# Patient Record
Sex: Female | Born: 1965 | Race: White | Hispanic: No | Smoking: Former smoker
Health system: Southern US, Community
[De-identification: ages and names within clinical notes are randomized; demographics above are authoritative.]

## PROBLEM LIST (undated history)

## (undated) DIAGNOSIS — M1990 Unspecified osteoarthritis, unspecified site: Secondary | ICD-10-CM

## (undated) DIAGNOSIS — J45909 Unspecified asthma, uncomplicated: Secondary | ICD-10-CM

## (undated) HISTORY — PX: KNEE SURGERY: SHX244

## (undated) HISTORY — PX: BACK SURGERY: SHX140

## (undated) HISTORY — PX: TONSILLECTOMY: SUR1361

---

## 2015-03-01 ENCOUNTER — Other Ambulatory Visit (HOSPITAL_COMMUNITY): Payer: Self-pay | Admitting: Rheumatology

## 2015-03-01 ENCOUNTER — Ambulatory Visit (HOSPITAL_COMMUNITY)
Admission: RE | Admit: 2015-03-01 | Discharge: 2015-03-01 | Disposition: A | Discharge status: Home / Self Care | Payer: BLUE CROSS/BLUE SHIELD | Source: Ambulatory Visit | Attending: Rheumatology | Admitting: Rheumatology

## 2015-03-01 DIAGNOSIS — R079 Chest pain, unspecified: Secondary | ICD-10-CM | POA: Diagnosis not present

## 2015-04-26 ENCOUNTER — Other Ambulatory Visit: Payer: Self-pay

## 2015-04-26 ENCOUNTER — Encounter (HOSPITAL_COMMUNITY): Payer: Self-pay | Admitting: Emergency Medicine

## 2015-04-26 ENCOUNTER — Emergency Department (HOSPITAL_COMMUNITY)
Admission: EM | Admit: 2015-04-26 | Discharge: 2015-04-26 | Disposition: A | Discharge status: Home / Self Care | Payer: BLUE CROSS/BLUE SHIELD | Attending: Emergency Medicine | Admitting: Emergency Medicine

## 2015-04-26 ENCOUNTER — Emergency Department (HOSPITAL_COMMUNITY): Payer: BLUE CROSS/BLUE SHIELD

## 2015-04-26 DIAGNOSIS — J45901 Unspecified asthma with (acute) exacerbation: Secondary | ICD-10-CM | POA: Insufficient documentation

## 2015-04-26 DIAGNOSIS — Z880 Allergy status to penicillin: Secondary | ICD-10-CM | POA: Insufficient documentation

## 2015-04-26 DIAGNOSIS — J019 Acute sinusitis, unspecified: Secondary | ICD-10-CM | POA: Insufficient documentation

## 2015-04-26 DIAGNOSIS — Z8739 Personal history of other diseases of the musculoskeletal system and connective tissue: Secondary | ICD-10-CM | POA: Insufficient documentation

## 2015-04-26 DIAGNOSIS — H9209 Otalgia, unspecified ear: Secondary | ICD-10-CM | POA: Insufficient documentation

## 2015-04-26 DIAGNOSIS — Z79899 Other long term (current) drug therapy: Secondary | ICD-10-CM | POA: Insufficient documentation

## 2015-04-26 DIAGNOSIS — J9801 Acute bronchospasm: Secondary | ICD-10-CM

## 2015-04-26 DIAGNOSIS — R110 Nausea: Secondary | ICD-10-CM

## 2015-04-26 DIAGNOSIS — Z720 Tobacco use: Secondary | ICD-10-CM | POA: Insufficient documentation

## 2015-04-26 HISTORY — DX: Unspecified asthma, uncomplicated: J45.909

## 2015-04-26 HISTORY — DX: Unspecified osteoarthritis, unspecified site: M19.90

## 2015-04-26 LAB — CBC
HEMATOCRIT: 40.3 % (ref 36.0–46.0)
Hemoglobin: 13.7 g/dL (ref 12.0–15.0)
MCH: 33.3 pg (ref 26.0–34.0)
MCHC: 34 g/dL (ref 30.0–36.0)
MCV: 98.1 fL (ref 78.0–100.0)
Platelets: 328 10*3/uL (ref 150–400)
RBC: 4.11 MIL/uL (ref 3.87–5.11)
RDW: 15.1 % (ref 11.5–15.5)
WBC: 14.9 10*3/uL — ABNORMAL HIGH (ref 4.0–10.5)

## 2015-04-26 LAB — BASIC METABOLIC PANEL
Anion gap: 6 (ref 5–15)
BUN: 13 mg/dL (ref 6–20)
CHLORIDE: 107 mmol/L (ref 101–111)
CO2: 26 mmol/L (ref 22–32)
Calcium: 8.8 mg/dL — ABNORMAL LOW (ref 8.9–10.3)
Creatinine, Ser: 0.96 mg/dL (ref 0.44–1.00)
GFR calc Af Amer: >60 mL/min (ref >60)
GLUCOSE: 91 mg/dL (ref 65–99)
POTASSIUM: 3.7 mmol/L (ref 3.5–5.1)
Sodium: 139 mmol/L (ref 135–145)

## 2015-04-26 LAB — TROPONIN I: Troponin I: <0.03 ng/mL (ref <0.031)

## 2015-04-26 MED ORDER — ONDANSETRON 4 MG PO TBDP: 4.0000 mg | ORAL_TABLET | Freq: Three times a day (TID) | ORAL | Status: DC | PRN

## 2015-04-26 MED ORDER — CEFPROZIL 500 MG PO TABS: 500.0000 mg | ORAL_TABLET | Freq: Two times a day (BID) | ORAL | Status: DC

## 2015-04-26 MED ORDER — ALBUTEROL SULFATE HFA 108 (90 BASE) MCG/ACT IN AERS: 1.0000 | INHALATION_SPRAY | Freq: Four times a day (QID) | RESPIRATORY_TRACT | Status: DC | PRN

## 2015-04-26 NOTE — ED Notes (Signed)
Pt reports cough x3 weeks, n/v x3 days. Pt reports cp onset 15 minutes prior to arrival. Pt non-diaphoretic. Mild dyspnea noted with exertion. nad noted.

## 2015-04-26 NOTE — ED Provider Notes (Signed)
CSN: [id number]     Arrival date & time 04/26/15  1054 History  This chart was scribed for Rolland Porter, MD by Marica Otter, ED Scribe. This patient was seen in room APA04/APA04 and the patient's care was started at 12:37 PM.   Chief Complaint  Patient presents with  . Chest Pain   The history is provided by the patient. No language interpreter was used.   PCP: [facility] HPI Comments: Madeline Ayala is a 49 y.o. female, with PMHx noted below including asthma and daily tobacco use (0.75 ppd), who presents to the Emergency Department complaining of sudden onset, right sided, 7/10 chest pain onset 15 minutes PTA to the ED. Pt specifies that the pain originated in her throat and radiating down to her chest. Pt further reports having a worsening productive cough and ear pain for the past three weeks with associated n/v over the last three days. Pt notes some of the vomiting is a result of coughing fits. Pt reports she is currently taking methotrexate and prednisone  for arthritis flare up.   Past Medical History  Diagnosis Date  . Arthritis   . Asthma    Past Surgical History  Procedure Laterality Date  . Back surgery    . Tonsillectomy    . Knee surgery     No family history on file. Social History  Substance Use Topics  . Smoking status: Current Every Day Smoker -- 0.75 packs/day    Types: Cigarettes  . Smokeless tobacco: None  . Alcohol Use: No   OB History    No data available     Review of Systems  Constitutional: Negative for fever, chills, diaphoresis, appetite change and fatigue.  HENT: Positive for ear pain. Negative for mouth sores, sore throat and trouble swallowing.   Eyes: Negative for visual disturbance.  Respiratory: Positive for cough. Negative for chest tightness, shortness of breath and wheezing.   Cardiovascular: Negative for chest pain.  Gastrointestinal: Positive for nausea and vomiting. Negative for abdominal pain, diarrhea  and abdominal distention.  Endocrine: Negative for polydipsia, polyphagia and polyuria.  Genitourinary: Negative for dysuria, frequency and hematuria.  Musculoskeletal: Negative for gait problem.  Skin: Negative for color change, pallor and rash.  Neurological: Negative for dizziness, syncope, light-headedness and headaches.  Hematological: Does not bruise/bleed easily.  Psychiatric/Behavioral: Negative for behavioral problems and confusion.   Allergies  Azithromycin; Codeine; Hydrocodone; Oxycodone; Penicillins; Percocet; Propoxyphene; Sulfa antibiotics; Tetracyclines & related; Tolectin; and Vancomycin  Home Medications   Prior to Admission medications   Medication Sig Start Date End Date Taking? Authorizing Provider  folic acid (FOLVITE) 1 MG tablet Take 2 mg by mouth daily.   Yes Historical Provider, MD  methotrexate (RHEUMATREX) 2.5 MG tablet Take 8 mg by mouth once a week. Caution:Chemotherapy. Protect from light.   Yes Historical Provider, MD  predniSONE (DELTASONE) 10 MG tablet Take 10 mg by mouth daily with breakfast.   Yes Historical Provider, MD  albuterol (PROVENTIL HFA;VENTOLIN HFA) 108 (90 BASE) MCG/ACT inhaler Inhale 1-2 puffs into the lungs every 6 (six) hours as needed for wheezing. 04/26/15   Rolland Porter, MD  cefPROZIL (CEFZIL) 500 MG tablet Take 1 tablet (500 mg total) by mouth 2 (two) times daily. 04/26/15   Rolland Porter, MD  ondansetron (ZOFRAN ODT) 4 MG disintegrating tablet Take 1 tablet (4 mg total) by mouth every 8 (eight) hours as needed for nausea. 04/26/15   Rolland Porter, MD   Triage Vitals: BP  116/72 mmHg  Pulse 68  Temp(Src) 97.9 F (36.6 C) (Oral)  Resp 16  Ht  (1.702 m)  Wt 185 lb (83.915 kg)  BMI 28.97 kg/m2  SpO2 100%  LMP 04/10/2015 Physical Exam  Constitutional: She is oriented to person, place, and time. She appears well-developed and well-nourished. No distress.  HENT:  Head: Normocephalic.  Nose: Mucosal edema and rhinorrhea present. Right  sinus exhibits maxillary sinus tenderness. Left sinus exhibits maxillary sinus tenderness.  Eyes: Conjunctivae are normal. Pupils are equal, round, and reactive to light. No scleral icterus.  Neck: Normal range of motion. Neck supple. No thyromegaly present.  Cardiovascular: Normal rate and regular rhythm.  Exam reveals no gallop and no friction rub.   No murmur heard. Pulmonary/Chest: Effort normal. No respiratory distress. She has wheezes (diffused wheezing with mild expiration ). She has no rales.  Abdominal: Soft. Bowel sounds are normal. She exhibits no distension. There is no tenderness. There is no rebound.  Musculoskeletal: Normal range of motion.  Neurological: She is alert and oriented to person, place, and time.  Skin: Skin is warm and dry. No rash noted.  Psychiatric: She has a normal mood and affect. Her behavior is normal.   ED Course  Procedures (including critical care time) DIAGNOSTIC STUDIES: Oxygen Saturation is 100% on RA, nl by my interpretation.    COORDINATION OF CARE: 12:43 PM: Discussed treatment plan which includes imaging, labs, EKG results and meds (sudafed, antibiotics, nebulizer) with pt at bedside; patient verbalizes understanding and agrees with treatment plan. Pt declines IV fluids and nebulizer Tx at ED wishes to go home with Rx instead.   Labs Review Labs Reviewed  BASIC METABOLIC PANEL - Abnormal; Notable for the following:    Calcium 8.8 (*)    All other components within normal limits  CBC - Abnormal; Notable for the following:    WBC 14.9 (*)    All other components within normal limits  TROPONIN I    Imaging Review Dg Chest 2 View  04/26/2015   CLINICAL DATA:  Acute right-sided chest pain.  EXAM: CHEST  2 VIEW  COMPARISON:  None.  FINDINGS: The heart size and mediastinal contours are within normal limits. Both lungs are clear. No pneumothorax or pleural effusion is noted. The visualized skeletal structures are unremarkable.  IMPRESSION: No  active cardiopulmonary disease.   Electronically Signed   By: Lupita Raider, M.D.   On: 04/26/2015 12:16   I have personally reviewed and evaluated these images and lab results as part of my medical decision-making.   EKG Interpretation None      MDM   Final diagnoses:  Acute sinusitis, recurrence not specified, unspecified location  Bronchospasm  Nausea   Chin with a multitude of complaints and symptoms. Clinically has normal electrolytes and chest x-ray without pneumonia. Sinusitis, bronchospasm, nausea. Offered treatment including IV medications fluids nebulizer. Politely declines. States she got her prescription go home that she feels "tired and worn out". No electrolyte abnormalities or clinical conditions that would preclude this being a safe option for her.  I personally performed the services described in this documentation, which was scribed in my presence. The recorded information has been reviewed and is accurate.    Rolland Porter, MD 04/26/15 1255

## 2015-04-26 NOTE — Discharge Instructions (Signed)
Asthma °Asthma is a recurring condition in which the airways tighten and narrow. Asthma can make it difficult to breathe. It can cause coughing, wheezing, and shortness of breath. Asthma episodes, also called asthma attacks, range from minor to life-threatening. Asthma cannot be cured, but medicines and lifestyle changes can help control it. °CAUSES °Asthma is believed to be caused by inherited (genetic) and environmental factors, but its exact cause is unknown. Asthma may be triggered by allergens, lung infections, or irritants in the air. Asthma triggers are different for each person. Common triggers include:  °· Animal dander. °· Dust mites. °· Cockroaches. °· Pollen from trees or grass. °· Mold. °· Smoke. °· Air pollutants such as dust, household cleaners, hair sprays, aerosol sprays, paint fumes, strong chemicals, or strong odors. °· Cold air, weather changes, and winds (which increase molds and pollens in the air). °· Strong emotional expressions such as crying or laughing hard. °· Stress. °· Certain medicines (such as aspirin) or types of drugs (such as beta-blockers). °· Sulfites in foods and drinks. Foods and drinks that may contain sulfites include dried fruit, potato chips, and sparkling grape juice. °· Infections or inflammatory conditions such as the flu, a cold, or an inflammation of the nasal membranes (rhinitis). °· Gastroesophageal reflux disease (GERD). °· Exercise or strenuous activity. °SYMPTOMS °Symptoms may occur immediately after asthma is triggered or many hours later. Symptoms include: °· Wheezing. °· Excessive nighttime or early morning coughing. °· Frequent or severe coughing with a common cold. °· Chest tightness. °· Shortness of breath. °DIAGNOSIS  °The diagnosis of asthma is made by a review of your medical history and a physical exam. Tests may also be performed. These may include: °· Lung function studies. These tests show how much air you breathe in and out. °· Allergy  tests. °· Imaging tests such as X-rays. °TREATMENT  °Asthma cannot be cured, but it can usually be controlled. Treatment involves identifying and avoiding your asthma triggers. It also involves medicines. There are 2 classes of medicine used for asthma treatment:  °· Controller medicines. These prevent asthma symptoms from occurring. They are usually taken every day. °· Reliever or rescue medicines. These quickly relieve asthma symptoms. They are used as needed and provide short-term relief. °Your health care provider will help you create an asthma action plan. An asthma action plan is a written plan for managing and treating your asthma attacks. It includes a list of your asthma triggers and how they may be avoided. It also includes information on when medicines should be taken and when their dosage should be changed. An action plan may also involve the use of a device called a peak flow meter. A peak flow meter measures how well the lungs are working. It helps you monitor your condition. °HOME CARE INSTRUCTIONS  °· Take medicines only as directed by your health care provider. Speak with your health care provider if you have questions about how or when to take the medicines. °· Use a peak flow meter as directed by your health care provider. Record and keep track of readings. °· Understand and use the action plan to help minimize or stop an asthma attack without needing to seek medical care. °· Control your home environment in the following ways to help prevent asthma attacks: °¨ Do not smoke. Avoid being exposed to secondhand smoke. °¨ Change your heating and air conditioning filter regularly. °¨ Limit your use of fireplaces and wood stoves. °¨ Get rid of pests (such as roaches and   mice) and their droppings.  Throw away plants if you see mold on them.  Clean your floors and dust regularly. Use unscented cleaning products.  Try to have someone else vacuum for you regularly. Stay out of rooms while they are  being vacuumed and for a short while afterward. If you vacuum, use a dust mask from a hardware store, a double-layered or microfilter vacuum cleaner bag, or a vacuum cleaner with a HEPA filter.  Replace carpet with wood, tile, or vinyl flooring. Carpet can trap dander and dust.  Use allergy-proof pillows, mattress covers, and box spring covers.  Wash bed sheets and blankets every week in hot water and dry them in a dryer.  Use blankets that are made of polyester or cotton.  Clean bathrooms and kitchens with bleach. If possible, have someone repaint the walls in these rooms with mold-resistant paint. Keep out of the rooms that are being cleaned and painted.  Wash hands frequently. SEEK MEDICAL CARE IF:   You have wheezing, shortness of breath, or a cough even if taking medicine to prevent attacks.  The colored mucus you cough up (sputum) is thicker than usual.  Your sputum changes from clear or white to yellow, green, gray, or bloody.  You have any problems that may be related to the medicines you are taking (such as a rash, itching, swelling, or trouble breathing).  You are using a reliever medicine more than 2-3 times per week.  Your peak flow is still at 50-79% of your personal best after following your action plan for 1 hour.  You have a fever. SEEK IMMEDIATE MEDICAL CARE IF:   You seem to be getting worse and are unresponsive to treatment during an asthma attack.  You are short of breath even at rest.  You get short of breath when doing very little physical activity.  You have difficulty eating, drinking, or talking due to asthma symptoms.  You develop chest pain.  You develop a fast heartbeat.  You have a bluish color to your lips or fingernails.  You are light-headed, dizzy, or faint.  Your peak flow is less than 50% of your personal best. MAKE SURE YOU:   Understand these instructions.  Will watch your condition.  Will get help right away if you are not  doing well or get worse. Document Released: 07/23/2005 Document Revised: 12/07/2013 Document Reviewed: 02/19/2013 [facility] Patient Information 2015 [place], [place]. This information is not intended to replace advice given to you by your health care provider. Make sure you discuss any questions you have with your health care provider.  Sinusitis Sinusitis is redness, soreness, and puffiness (inflammation) of the air pockets in the bones of your face (sinuses). The redness, soreness, and puffiness can cause air and mucus to get trapped in your sinuses. This can allow germs to grow and cause an infection.  HOME CARE   Drink enough fluids to keep your pee (urine) clear or pale yellow.  Use a humidifier in your home.  Run a hot shower to create steam in the bathroom. Sit in the bathroom with the door closed. Breathe in the steam 3-4 times a day.  Put a warm, moist washcloth on your face 3-4 times a day, or as told by your doctor.  Use salt water sprays (saline sprays) to wet the thick fluid in your nose. This can help the sinuses drain.  Only take medicine as told by your doctor. GET HELP RIGHT AWAY IF:   Your pain gets worse.  You have very bad headaches.  You are sick to your stomach (nauseous).  You throw up (vomit).  You are very sleepy (drowsy) all the time.  Your face is puffy (swollen).  Your vision changes.  You have a stiff neck.  You have trouble breathing. MAKE SURE YOU:   Understand these instructions.  Will watch your condition.  Will get help right away if you are not doing well or get worse. Document Released: 01/09/2008 Document Revised: 04/16/2012 Document Reviewed: 02/26/2012 [facility] Patient Information 2015 [place], [place]. This information is not intended to replace advice given to you by your health care provider. Make sure you discuss any questions you have with your health care provider.

## 2015-06-23 ENCOUNTER — Other Ambulatory Visit (HOSPITAL_COMMUNITY)
Admission: RE | Admit: 2015-06-23 | Discharge: 2015-06-23 | Disposition: A | Discharge status: Home / Self Care | Payer: 59 | Source: Ambulatory Visit | Attending: Interventional Radiology | Admitting: Interventional Radiology

## 2015-06-23 DIAGNOSIS — Z029 Encounter for administrative examinations, unspecified: Secondary | ICD-10-CM | POA: Insufficient documentation

## 2015-06-23 LAB — COMPREHENSIVE METABOLIC PANEL
ALBUMIN: 4.1 g/dL (ref 3.5–5.0)
ALK PHOS: 55 U/L (ref 38–126)
ALT: 11 U/L — AB (ref 14–54)
AST: 18 U/L (ref 15–41)
Anion gap: 8 (ref 5–15)
BILIRUBIN TOTAL: 0.4 mg/dL (ref 0.3–1.2)
BUN: 10 mg/dL (ref 6–20)
CALCIUM: 9.1 mg/dL (ref 8.9–10.3)
CO2: 22 mmol/L (ref 22–32)
CREATININE: 1.04 mg/dL — AB (ref 0.44–1.00)
Chloride: 107 mmol/L (ref 101–111)
GFR calc Af Amer: >60 mL/min (ref >60)
GLUCOSE: 103 mg/dL — AB (ref 65–99)
Potassium: 3.8 mmol/L (ref 3.5–5.1)
Sodium: 137 mmol/L (ref 135–145)
TOTAL PROTEIN: 7.1 g/dL (ref 6.5–8.1)

## 2015-06-23 LAB — CBC WITH DIFFERENTIAL/PLATELET
BASOS ABS: 0 10*3/uL (ref 0.0–0.1)
BASOS PCT: 0 %
Eosinophils Absolute: 0.1 10*3/uL (ref 0.0–0.7)
Eosinophils Relative: 1 %
HEMATOCRIT: 40.2 % (ref 36.0–46.0)
HEMOGLOBIN: 13.5 g/dL (ref 12.0–15.0)
LYMPHS PCT: 45 %
Lymphs Abs: 5 10*3/uL — ABNORMAL HIGH (ref 0.7–4.0)
MCH: 34 pg (ref 26.0–34.0)
MCHC: 33.6 g/dL (ref 30.0–36.0)
MCV: 101.3 fL — AB (ref 78.0–100.0)
MONO ABS: 0.5 10*3/uL (ref 0.1–1.0)
Monocytes Relative: 5 %
NEUTROS ABS: 5.4 10*3/uL (ref 1.7–7.7)
NEUTROS PCT: 49 %
Platelets: 334 10*3/uL (ref 150–400)
RBC: 3.97 MIL/uL (ref 3.87–5.11)
RDW: 14.9 % (ref 11.5–15.5)
WBC: 11 10*3/uL — ABNORMAL HIGH (ref 4.0–10.5)

## 2015-10-10 ENCOUNTER — Other Ambulatory Visit (HOSPITAL_COMMUNITY)
Admission: RE | Admit: 2015-10-10 | Discharge: 2015-10-10 | Disposition: A | Discharge status: Home / Self Care | Payer: 59 | Source: Ambulatory Visit | Attending: Rheumatology | Admitting: Rheumatology

## 2015-10-10 DIAGNOSIS — Z79899 Other long term (current) drug therapy: Secondary | ICD-10-CM | POA: Insufficient documentation

## 2015-10-10 DIAGNOSIS — Z5181 Encounter for therapeutic drug level monitoring: Secondary | ICD-10-CM | POA: Insufficient documentation

## 2015-10-10 LAB — CBC WITH DIFFERENTIAL/PLATELET
BASOS ABS: 0 10*3/uL (ref 0.0–0.1)
BASOS PCT: 0 %
EOS ABS: 0.1 10*3/uL (ref 0.0–0.7)
Eosinophils Relative: 1 %
HEMATOCRIT: 42 % (ref 36.0–46.0)
HEMOGLOBIN: 14.2 g/dL (ref 12.0–15.0)
Lymphocytes Relative: 34 %
Lymphs Abs: 4.4 10*3/uL — ABNORMAL HIGH (ref 0.7–4.0)
MCH: 34.1 pg — ABNORMAL HIGH (ref 26.0–34.0)
MCHC: 33.8 g/dL (ref 30.0–36.0)
MCV: 100.7 fL — ABNORMAL HIGH (ref 78.0–100.0)
Monocytes Absolute: 0.6 10*3/uL (ref 0.1–1.0)
Monocytes Relative: 5 %
NEUTROS ABS: 7.7 10*3/uL (ref 1.7–7.7)
Neutrophils Relative %: 60 %
Platelets: 276 10*3/uL (ref 150–400)
RBC: 4.17 MIL/uL (ref 3.87–5.11)
RDW: 13.8 % (ref 11.5–15.5)
WBC: 12.8 10*3/uL — ABNORMAL HIGH (ref 4.0–10.5)

## 2015-10-10 LAB — COMPREHENSIVE METABOLIC PANEL
ALBUMIN: 4 g/dL (ref 3.5–5.0)
ALK PHOS: 68 U/L (ref 38–126)
ALT: 15 U/L (ref 14–54)
ANION GAP: 5 (ref 5–15)
AST: 16 U/L (ref 15–41)
BUN: 12 mg/dL (ref 6–20)
CALCIUM: 9 mg/dL (ref 8.9–10.3)
CO2: 26 mmol/L (ref 22–32)
CREATININE: 0.93 mg/dL (ref 0.44–1.00)
Chloride: 105 mmol/L (ref 101–111)
GFR calc Af Amer: >60 mL/min (ref >60)
GFR calc non Af Amer: >60 mL/min (ref >60)
GLUCOSE: 91 mg/dL (ref 65–99)
Potassium: 4 mmol/L (ref 3.5–5.1)
SODIUM: 136 mmol/L (ref 135–145)
Total Bilirubin: 0.5 mg/dL (ref 0.3–1.2)
Total Protein: 7.2 g/dL (ref 6.5–8.1)

## 2016-02-14 ENCOUNTER — Other Ambulatory Visit (HOSPITAL_COMMUNITY)
Admission: RE | Admit: 2016-02-14 | Discharge: 2016-02-14 | Disposition: A | Discharge status: Home / Self Care | Payer: 59 | Source: Ambulatory Visit | Attending: Rheumatology | Admitting: Rheumatology

## 2016-02-14 DIAGNOSIS — Z79899 Other long term (current) drug therapy: Secondary | ICD-10-CM | POA: Diagnosis present

## 2016-02-14 LAB — COMPREHENSIVE METABOLIC PANEL
ALBUMIN: 4.3 g/dL (ref 3.5–5.0)
ALK PHOS: 68 U/L (ref 38–126)
ALT: 17 U/L (ref 14–54)
ANION GAP: 7 (ref 5–15)
AST: 23 U/L (ref 15–41)
BUN: 12 mg/dL (ref 6–20)
CALCIUM: 9.5 mg/dL (ref 8.9–10.3)
CHLORIDE: 106 mmol/L (ref 101–111)
CO2: 24 mmol/L (ref 22–32)
Creatinine, Ser: 1.03 mg/dL — ABNORMAL HIGH (ref 0.44–1.00)
GFR calc non Af Amer: >60 mL/min (ref >60)
Glucose, Bld: 143 mg/dL — ABNORMAL HIGH (ref 65–99)
POTASSIUM: 3.7 mmol/L (ref 3.5–5.1)
SODIUM: 137 mmol/L (ref 135–145)
TOTAL PROTEIN: 7.6 g/dL (ref 6.5–8.1)
Total Bilirubin: 0.6 mg/dL (ref 0.3–1.2)

## 2016-02-14 LAB — CBC WITH DIFFERENTIAL/PLATELET
BASOS PCT: 0 %
Basophils Absolute: 0 10*3/uL (ref 0.0–0.1)
EOS ABS: 0 10*3/uL (ref 0.0–0.7)
EOS PCT: 0 %
HCT: 40.2 % (ref 36.0–46.0)
HEMOGLOBIN: 13.8 g/dL (ref 12.0–15.0)
LYMPHS ABS: 4.7 10*3/uL — AB (ref 0.7–4.0)
Lymphocytes Relative: 42 %
MCH: 34.8 pg — AB (ref 26.0–34.0)
MCHC: 34.3 g/dL (ref 30.0–36.0)
MCV: 101.3 fL — ABNORMAL HIGH (ref 78.0–100.0)
MONOS PCT: 4 %
Monocytes Absolute: 0.4 10*3/uL (ref 0.1–1.0)
NEUTROS PCT: 54 %
Neutro Abs: 6 10*3/uL (ref 1.7–7.7)
PLATELETS: 332 10*3/uL (ref 150–400)
RBC: 3.97 MIL/uL (ref 3.87–5.11)
RDW: 13.5 % (ref 11.5–15.5)
WBC: 11.2 10*3/uL — AB (ref 4.0–10.5)

## 2016-10-23 ENCOUNTER — Telehealth: Payer: Self-pay | Admitting: Radiology

## 2016-10-23 ENCOUNTER — Other Ambulatory Visit (HOSPITAL_COMMUNITY)
Admission: RE | Admit: 2016-10-23 | Discharge: 2016-10-23 | Disposition: A | Discharge status: Home / Self Care | Payer: 59 | Source: Ambulatory Visit | Attending: Rheumatology | Admitting: Rheumatology

## 2016-10-23 DIAGNOSIS — Z79899 Other long term (current) drug therapy: Secondary | ICD-10-CM | POA: Diagnosis not present

## 2016-10-23 LAB — CBC WITH DIFFERENTIAL/PLATELET
BASOS ABS: 0 10*3/uL (ref 0.0–0.1)
BASOS PCT: 0 %
EOS ABS: 0.1 10*3/uL (ref 0.0–0.7)
EOS PCT: 1 %
HCT: 39.1 % (ref 36.0–46.0)
Hemoglobin: 13 g/dL (ref 12.0–15.0)
LYMPHS PCT: 28 %
Lymphs Abs: 3.2 10*3/uL (ref 0.7–4.0)
MCH: 33.6 pg (ref 26.0–34.0)
MCHC: 33.2 g/dL (ref 30.0–36.0)
MCV: 101 fL — AB (ref 78.0–100.0)
Monocytes Absolute: 0.8 10*3/uL (ref 0.1–1.0)
Monocytes Relative: 7 %
Neutro Abs: 7.4 10*3/uL (ref 1.7–7.7)
Neutrophils Relative %: 64 %
PLATELETS: 271 10*3/uL (ref 150–400)
RBC: 3.87 MIL/uL (ref 3.87–5.11)
RDW: 13.2 % (ref 11.5–15.5)
WBC: 11.6 10*3/uL — AB (ref 4.0–10.5)

## 2016-10-23 LAB — COMPREHENSIVE METABOLIC PANEL
ALBUMIN: 4.4 g/dL (ref 3.5–5.0)
ALT: 17 U/L (ref 14–54)
AST: 19 U/L (ref 15–41)
Alkaline Phosphatase: 70 U/L (ref 38–126)
Anion gap: 6 (ref 5–15)
BUN: 13 mg/dL (ref 6–20)
CHLORIDE: 103 mmol/L (ref 101–111)
CO2: 29 mmol/L (ref 22–32)
CREATININE: 0.94 mg/dL (ref 0.44–1.00)
Calcium: 9.8 mg/dL (ref 8.9–10.3)
GFR calc Af Amer: >60 mL/min (ref >60)
GFR calc non Af Amer: >60 mL/min (ref >60)
GLUCOSE: 80 mg/dL (ref 65–99)
POTASSIUM: 4.6 mmol/L (ref 3.5–5.1)
SODIUM: 138 mmol/L (ref 135–145)
Total Bilirubin: 0.4 mg/dL (ref 0.3–1.2)
Total Protein: 7.6 g/dL (ref 6.5–8.1)

## 2016-10-23 NOTE — Telephone Encounter (Signed)
-----   Message from Pollyann SavoyShaili Deveshwar, MD sent at 10/23/2016 12:49 PM EDT ----- Labs are stable

## 2016-10-23 NOTE — Telephone Encounter (Signed)
I have called patient to advise labs are stable  

## 2016-10-23 NOTE — Progress Notes (Signed)
Labs are stable.

## 2016-10-29 DIAGNOSIS — M5136 Other intervertebral disc degeneration, lumbar region: Secondary | ICD-10-CM | POA: Insufficient documentation

## 2016-10-29 DIAGNOSIS — Z8709 Personal history of other diseases of the respiratory system: Secondary | ICD-10-CM | POA: Insufficient documentation

## 2016-10-29 DIAGNOSIS — L409 Psoriasis, unspecified: Secondary | ICD-10-CM | POA: Insufficient documentation

## 2016-10-29 DIAGNOSIS — Z8719 Personal history of other diseases of the digestive system: Secondary | ICD-10-CM | POA: Insufficient documentation

## 2016-10-29 DIAGNOSIS — L4050 Arthropathic psoriasis, unspecified: Secondary | ICD-10-CM | POA: Insufficient documentation

## 2016-10-29 DIAGNOSIS — F40298 Other specified phobia: Secondary | ICD-10-CM | POA: Insufficient documentation

## 2016-10-29 DIAGNOSIS — M5030 Other cervical disc degeneration, unspecified cervical region: Secondary | ICD-10-CM | POA: Insufficient documentation

## 2016-10-29 DIAGNOSIS — Z8739 Personal history of other diseases of the musculoskeletal system and connective tissue: Secondary | ICD-10-CM | POA: Insufficient documentation

## 2016-10-29 DIAGNOSIS — Z79899 Other long term (current) drug therapy: Secondary | ICD-10-CM | POA: Insufficient documentation

## 2016-10-29 NOTE — Progress Notes (Addendum)
Office Visit Note  Patient: Madeline Ayala             Date of Birth: 11/19/1965           MRN: [id number]             PCP: [facility] Referring: [facility] Visit Date: 10/30/2016 Occupation: '@GUAROCC'$ @    Subjective:  Joint Pain (has ran out of meds. c/o stiffness pain ) and Medication Management (wants to know if we can work on approval for [employer] )   History of Present Illness: Madeline Ayala is a 51 y.o. Ayala  Off of MTX for 3 weeks. Trouble paying for medications, labs, office visit. Also, mtx not as effective now as it use to be (before she was off of mtx for about 3 weeks).  We tried to see if otezla would be an option.   Having swelling in hands and feet and psoriasis flaring. joint pain affecting job.  Not taking clobetasol because of the medicine is too expensive.   Activities of Daily Living:  Patient reports morning stiffness for 120 minute.   Patient Reports nocturnal pain.  Difficulty dressing/grooming: Reports Difficulty climbing stairs: Reports Difficulty getting out of chair: Reports Difficulty using hands for taps, buttons, cutlery, and/or writing: Reports   Review of Systems  Constitutional: Negative for fatigue.  HENT: Negative for mouth sores and mouth dryness.   Eyes: Negative for dryness.  Respiratory: Negative for shortness of breath.   Gastrointestinal: Negative for constipation and diarrhea.  Musculoskeletal: Negative for myalgias and myalgias.  Skin: Negative for sensitivity to sunlight.  Psychiatric/Behavioral: Negative for decreased concentration and sleep disturbance.    PMFS History:  Patient Active Problem List   Diagnosis Date Noted  . Psoriatic arthritis ([place]) 10/29/2016  . Psoriasis 10/29/2016  . High risk medication use 10/29/2016  . Needle phobia 10/29/2016  . DDD (degenerative disc disease), lumbar 10/29/2016  . DDD (degenerative disc disease), cervical 10/29/2016  . History  of scoliosis 10/29/2016  . History of asthma 10/29/2016  . History of duodenal ulcer 10/29/2016    Past Medical History:  Diagnosis Date  . Arthritis   . Asthma     No family history on file. Past Surgical History:  Procedure Laterality Date  . BACK SURGERY    . KNEE SURGERY    . TONSILLECTOMY     Social History   Social History Narrative  . No narrative on file     Objective: Vital Signs: BP 98/60   Pulse (!) 58   Resp 16   Ht '5\' 7"'$  (1.702 m)   Wt 156 lb (70.8 kg)   LMP 04/10/2015   BMI 24.43 kg/m    Physical Exam  Constitutional: She is oriented to person, place, and time. She appears well-developed and well-nourished.  HENT:  Head: Normocephalic and atraumatic.  Eyes: EOM are normal. Pupils are equal, round, and reactive to light.  Cardiovascular: Normal rate, regular rhythm and normal heart sounds.  Exam reveals no gallop and no friction rub.   No murmur heard. Pulmonary/Chest: Effort normal and breath sounds normal. She has no wheezes. She has no rales.  Abdominal: Soft. Bowel sounds are normal. She exhibits no distension. There is no tenderness. There is no guarding. No hernia.  Musculoskeletal: Normal range of motion. She exhibits no edema, tenderness or deformity.  Lymphadenopathy:    She has no cervical adenopathy.  Neurological: She is alert and oriented to person, place, and  time. Coordination normal.  Skin: Skin is warm and dry. Capillary refill takes less than 2 seconds. No rash noted.  Psychiatric: She has a normal mood and affect. Her behavior is normal.  Nursing note and vitals reviewed.    Musculoskeletal Exam:  Full range of motion of all joints Grip strength is equal and strong bilaterally Fibromyalgia tender points are all absent  CDAI Exam: CDAI Homunculus Exam:   Tenderness:  Right hand: 1st MCP, 2nd MCP, 3rd MCP, 4th MCP and 5th MCP Left hand: 1st MCP, 2nd MCP, 3rd MCP, 4th MCP and 5th MCP  Swelling:  Right hand: 3rd MCP Left  hand: 3rd MCP  Joint Counts:  CDAI Tender Joint count: 10 CDAI Swollen Joint count: 2  Mild synovitis to a few joints noted above.   Investigation: Findings:  Labs from February 14, 2016 show CMP with GFR normal except for elevated creatinine at 103, but close to normal limits.  GFR is normal.  CBC with diff is normal except for MCV slightly elevated at 101.3, which she can discuss with her PCP.    Bilateral hands, 2 views, show DIP and PIP narrowing bilaterally.  Mild MCP narrowing bilaterally.  No erosions.  No comparisons are available.    Bilateral feet, 2 views, show DIP and PIP joint space narrowing.  No erosions.   Hospital Outpatient Visit on 10/23/2016  Component Date Value Ref Range Status  . WBC 10/23/2016 11.6* 4.0 - 10.5 K/uL Final  . RBC 10/23/2016 3.87  3.87 - 5.11 MIL/uL Final  . Hemoglobin 10/23/2016 13.0  12.0 - 15.0 g/dL Final  . HCT 10/23/2016 39.1  36.0 - 46.0 % Final  . MCV 10/23/2016 101.0* 78.0 - 100.0 fL Final  . MCH 10/23/2016 33.6  26.0 - 34.0 pg Final  . MCHC 10/23/2016 33.2  30.0 - 36.0 g/dL Final  . RDW 10/23/2016 13.2  11.5 - 15.5 % Final  . Platelets 10/23/2016 271  150 - 400 K/uL Final  . Neutrophils Relative % 10/23/2016 64  % Final  . Neutro Abs 10/23/2016 7.4  1.7 - 7.7 K/uL Final  . Lymphocytes Relative 10/23/2016 28  % Final  . Lymphs Abs 10/23/2016 3.2  0.7 - 4.0 K/uL Final  . Monocytes Relative 10/23/2016 7  % Final  . Monocytes Absolute 10/23/2016 0.8  0.1 - 1.0 K/uL Final  . Eosinophils Relative 10/23/2016 1  % Final  . Eosinophils Absolute 10/23/2016 0.1  0.0 - 0.7 K/uL Final  . Basophils Relative 10/23/2016 0  % Final  . Basophils Absolute 10/23/2016 0.0  0.0 - 0.1 K/uL Final  . Sodium 10/23/2016 138  135 - 145 mmol/L Final  . Potassium 10/23/2016 4.6  3.5 - 5.1 mmol/L Final  . Chloride 10/23/2016 103  101 - 111 mmol/L Final  . CO2 10/23/2016 29  22 - 32 mmol/L Final  . Glucose, Bld 10/23/2016 80  65 - 99 mg/dL Final  . BUN 10/23/2016  13  6 - 20 mg/dL Final  . Creatinine, Ser 10/23/2016 0.94  0.44 - 1.00 mg/dL Final  . Calcium 10/23/2016 9.8  8.9 - 10.3 mg/dL Final  . Total Protein 10/23/2016 7.6  6.5 - 8.1 g/dL Final  . Albumin 10/23/2016 4.4  3.5 - 5.0 g/dL Final  . AST 10/23/2016 19  15 - 41 U/L Final  . ALT 10/23/2016 17  14 - 54 U/L Final  . Alkaline Phosphatase 10/23/2016 70  38 - 126 U/L Final  . Total Bilirubin 10/23/2016 0.4  0.3 - 1.2 mg/dL Final  . GFR calc non Af Amer 10/23/2016 >60  >60 mL/min Final  . GFR calc Af Amer 10/23/2016 >60  >60 mL/min Final   Comment: (NOTE) The eGFR has been calculated using the CKD EPI equation. This calculation has not been validated in all clinical situations. eGFR's persistently <60 mL/min signify possible Chronic Kidney Disease.   . Anion gap 10/23/2016 6  5 - 15 Final      Imaging: No results found.  Speciality Comments: No specialty comments available.    Procedures:  No procedures performed Allergies: Azithromycin; Erythromycin; Penicillins; Sulfa antibiotics; Tetracyclines & related; Tolectin [tolmetin]; Vancomycin; Codeine; Hydrocodone; Oxycodone; Percocet [oxycodone-acetaminophen]; and Propoxyphene   Assessment / Plan:     Visit Diagnoses: Psoriatic arthritis ([place])  Psoriasis - [place]  High risk medication use - ?methotrexate- 2.'5mg'$  10 per week in dictation  Needle phobia  DDD (degenerative disc disease), cervical  DDD (degenerative disc disease), lumbar  History of scoliosis  History of asthma - mild  History of duodenal ulcer   Plan: #1: Psoriatic arthritis and psoriasis. Currently having a flare. Complaining of pain to all joints. Difficulty due her work. Was not responding well to the methotrexate anymore. Then for the last 3 weeks, she was off of her methotrexate and ended up having more pain and more flare.  #2: High risk prescription. Patient has needle phobia and cannot take injectable methotrexate (which actually is more  affordable at $30 for 3 month supply versus $120 for 3 month supply). She's been off of the medication for 3 weeks now. Prior to being off of the medication, the methotrexate was not working as well for her as it used to.  #3: CBC with differential and CMP with GFR were within normal limits as of March  #4: Apply for [facility]. Patient is very worried that her [place] healthcare will not pay for all of the medication in the 20% that she might be responsible for may not be affordable. However, with the current situation, with methotrexate not working adequately, we have offered the patient the option of a biologic. Especially with a history of needle phobia, that [place] may be a viable option. She may qualify for patient assistance program once we start the application process and look at our options. Patient understands and is agreeable.  #5: CBC with differential, CMP with GFR every 3 months starting in June  #6: If she does qualify for [place] and she does well monotherapy, we would like to discontinue methotrexate since patient has concerns about hair loss issue.  #7: Alopecia. Patient's main concern today along with her joint pain is at hair loss when she is using methotrexate     Orders: No orders of the defined types were placed in this encounter.  No orders of the defined types were placed in this encounter.   Face-to-face time spent with patient was 30 minutes. 50% of time was spent in counseling and coordination of care.  Follow-Up Instructions: No Follow-up on file.   Eliezer Lofts, PA-C  Patient has synovitis in multiple joints, exam as described above. She has needle phobia. He had detailed discussion regarding different options and we have discussed otezla in the past. We will try to apply for it again. I examined and evaluated the patient with Eliezer Lofts PA. The plan of care was discussed as noted above.  Bo Merino, MDNote - This record has been created using  [employer].  Chart creation errors have been sought,  but may not always  have been located. Such creation errors do not reflect on  the standard of medical care.

## 2016-10-30 ENCOUNTER — Ambulatory Visit (INDEPENDENT_AMBULATORY_CARE_PROVIDER_SITE_OTHER): Payer: 59 | Admitting: Rheumatology

## 2016-10-30 ENCOUNTER — Telehealth: Payer: Self-pay | Admitting: Pharmacist

## 2016-10-30 ENCOUNTER — Encounter: Payer: Self-pay | Admitting: Rheumatology

## 2016-10-30 VITALS — BP 98/60 | HR 58 | Resp 16 | Ht 67.0 in | Wt 156.0 lb

## 2016-10-30 DIAGNOSIS — F40298 Other specified phobia: Secondary | ICD-10-CM | POA: Diagnosis not present

## 2016-10-30 DIAGNOSIS — L4050 Arthropathic psoriasis, unspecified: Secondary | ICD-10-CM | POA: Diagnosis not present

## 2016-10-30 DIAGNOSIS — L409 Psoriasis, unspecified: Secondary | ICD-10-CM | POA: Diagnosis not present

## 2016-10-30 DIAGNOSIS — Z8709 Personal history of other diseases of the respiratory system: Secondary | ICD-10-CM

## 2016-10-30 DIAGNOSIS — Z79899 Other long term (current) drug therapy: Secondary | ICD-10-CM

## 2016-10-30 DIAGNOSIS — M5136 Other intervertebral disc degeneration, lumbar region: Secondary | ICD-10-CM

## 2016-10-30 DIAGNOSIS — Z8719 Personal history of other diseases of the digestive system: Secondary | ICD-10-CM

## 2016-10-30 DIAGNOSIS — M5030 Other cervical disc degeneration, unspecified cervical region: Secondary | ICD-10-CM

## 2016-10-30 DIAGNOSIS — Z8739 Personal history of other diseases of the musculoskeletal system and connective tissue: Secondary | ICD-10-CM

## 2016-10-30 MED ORDER — FOLIC ACID 1 MG PO TABS: 2.0000 mg | ORAL_TABLET | Freq: Every day | ORAL | 4 refills | Status: DC

## 2016-10-30 MED ORDER — METHOTREXATE 2.5 MG PO TABS: 25.0000 mg | ORAL_TABLET | ORAL | 0 refills | Status: AC

## 2016-10-30 NOTE — Patient Instructions (Signed)
Apremilast oral tablets What is this medicine? APREMILAST (a PRE mil ast) is used to treat plaque psoriasis and psoriatic arthritis. This medicine may be used for other purposes; ask your health care provider or pharmacist if you have questions. COMMON BRAND NAME(S): Otezla What should I tell my health care provider before I take this medicine? They need to know if you have any of these conditions: -dehydration -kidney disease -mental illness -an unusual or allergic reaction to apremilast, other medicines, foods, dyes, or preservatives -pregnant or trying to get pregnant -breast-feeding How should I use this medicine? Take this medicine by mouth with a glass of water. Follow the directions on the prescription label. Do not cut, crush or chew this medicine. You can take it with or without food. If it upsets your stomach, take it with food. Take your medicine at regular intervals. Do not take it more often than directed. Do not stop taking except on your doctor's advice. Talk to your pediatrician regarding the use of this medicine in children. Special care may be needed. Overdosage: If you think you have taken too much of this medicine contact a poison control center or emergency room at once. NOTE: This medicine is only for you. Do not share this medicine with others. What if I miss a dose? If you miss a dose, take it as soon as you can. If it is almost time for your next dose, take only that dose. Do not take double or extra doses. What may interact with this medicine? This medicine may interact with the following medications: -certain medicines for seizures like carbamazepine, phenobarbital, phenytoin -rifampin This list may not describe all possible interactions. Give your health care provider a list of all the medicines, herbs, non-prescription drugs, or dietary supplements you use. Also tell them if you smoke, drink alcohol, or use illegal drugs. Some items may interact with your  medicine. What should I watch for while using this medicine? Tell your doctor or healthcare professional if your symptoms do not start to get better or if they get worse. Patients and their families should watch out for new or worsening depression or thoughts of suicide. Also watch out for sudden changes in feelings such as feeling anxious, agitated, panicky, irritable, hostile, aggressive, impulsive, severely restless, overly excited and hyperactive, or not being able to sleep. If this happens, call your health care professional. Check with your doctor or health care professional if you get an attack of severe diarrhea, nausea and vomiting, or if you sweat a lot. The loss of too much body fluid can make it dangerous for you to take this medicine. What side effects may I notice from receiving this medicine? Side effects that you should report to your doctor or health care professional as soon as possible: -depressed mood -weight loss Side effects that usually do not require medical attention (report to your doctor or health care professional if they continue or are bothersome): -diarrhea -headache -nausea, vomiting This list may not describe all possible side effects. Call your doctor for medical advice about side effects. You may report side effects to FDA at 1-800-FDA-1088. Where should I keep my medicine? Keep out of the reach of children. Store below 30 degrees C (86 degrees F). Throw away any unused medicine after the expiration date. NOTE: This sheet is a summary. It may not cover all possible information. If you have questions about this medicine, talk to your doctor, pharmacist, or health care provider.  2018 Elsevier/Gold Standard (2016-02-08 10:55:44)  

## 2016-10-30 NOTE — Progress Notes (Signed)
Pharmacy Note  Subjective: Patient presents today to the [facility] to see Dr. Gabrielle Dareeveshwar/Mr. Panwala.  Patient was prescribed Otezla for psoriasis and psoriatic arthritis.  Patient was seen by the pharmacist for counseling on high risk medication.  Objective: CMP     Component Value Date/Time   NA 138 10/23/2016 1207   K 4.6 10/23/2016 1207   CL 103 10/23/2016 1207   CO2 29 10/23/2016 1207   GLUCOSE 80 10/23/2016 1207   BUN 13 10/23/2016 1207   CREATININE 0.94 10/23/2016 1207   CALCIUM 9.8 10/23/2016 1207   PROT 7.6 10/23/2016 1207   ALBUMIN 4.4 10/23/2016 1207   AST 19 10/23/2016 1207   ALT 17 10/23/2016 1207   ALKPHOS 70 10/23/2016 1207   BILITOT 0.4 10/23/2016 1207   GFRNONAA >60 10/23/2016 1207   GFRAA >60 10/23/2016 1207   Assessment/Plan:  Counseled patient that Henderson [place]tezla is a PDE 4 inhibitor that works to treat psoriasis and the joint pain and tenderness of psoriatic arthritis.  Counseled patient on purpose, proper use, and adverse effects of Otezla.  Reviewed the most common adverse effects of weight loss, depression, nausea/diarrhea/vomiting, headaches, and nasal congestion.  Provided patient with medication education material and answered all questions.  Patient consented to [place].  Will apply for [place] through patient's insurance and will update patient with outcome.  I provided her with the information on how to sign up for [facility] co-pay card.  Patient confirms she will call to sign up for a co-pay card.    Lilla Shookachel Henderson, Pharm.D., BCPS, CPP Clinical Pharmacist Pager: [phone] Phone: [phone] 10/30/2016 10:30 AM

## 2016-10-30 NOTE — Telephone Encounter (Signed)
Received request from [facility] for additional clinical information on [place] PA.  I called [facility] and spoke to [place] EchoJessica and provided the information requested.  The PA request was sent for review.  Will update patient once I know status of request.   Lilla Shookachel Henderson, Pharm.D., BCPS, CPP Clinical Pharmacist Pager: [phone] Phone: [phone] 10/30/2016 4:41 PM

## 2016-10-31 ENCOUNTER — Telehealth: Payer: Self-pay | Admitting: Pharmacist

## 2016-10-31 MED ORDER — APREMILAST 30 MG PO TABS: 30.0000 mg | ORAL_TABLET | Freq: Two times a day (BID) | ORAL | 2 refills | Status: DC

## 2016-10-31 NOTE — Telephone Encounter (Signed)
Received fax from [facility] reqesting clarification: "Jobani Sabado [place]Otezla maintenance is ready to ship. Please provide a starter if needed."  I called [facility] and spoke to [place]ill.  Informed them that start pack was provided to patient already.  They confirm that Madeline Ayala [place]Otezla is ready for patient and has $0 copay.  Patient should call to initiate refill.   I updated patient on this information.  Also noted she now has follow up scheduled for 01/29/17.  Patient denies any questions or concerns regarding her medications at this time.   [name], Pharm.D., BCPS, CPP Clinical Pharmacist Pager: [phone] Phone: [phone] 10/31/2016 2:13 PM

## 2016-10-31 NOTE — Telephone Encounter (Signed)
Patient's Jenita Rayfield BaltimoreOtezla was approved through her [employer] company (reference # [id number], effective dates: 10/30/16 - 10/30/17).    I called patient to inform her.  Advised that we can provide starter pack and send in maintenance prescription.  Patient confirms she will come by the office to pick up starter pack.  Provided patient with the phone number of [facility].  Also provided patient with phone number to sign up for [facility] copay card.  Advised patient to call me if she has any difficulty with her prescription.   Medication Samples have been set aside for the patient.  Drug name: Lynita [place]rose[place] BaltimoreOtezla, Strength: 10, 20, 30 mg Starter Pack, Qty: 1 pack, LOT: [id number], Exp.Date: 01/2017  Dosing instructions: Take as instructed per package instructions.    The patient has been instructed regarding the correct time, dose, and frequency of taking this medication, including desired effects and most common side effects.   Noted patient did not schedule a follow up visit.  Discussed with Mr. Leane Callanwala, patient will be due for follow up in 3 months.  I sent a message to the front desk requesting patient schedule follow up in 3 months.    Lilla Shookachel Jaysun Wessels, Pharm.D., BCPS, CPP Clinical Pharmacist Pager: [phone] Phone: [phone] 10/31/2016 9:50 AM

## 2016-12-12 ENCOUNTER — Telehealth: Payer: Self-pay | Admitting: Rheumatology

## 2016-12-12 NOTE — Telephone Encounter (Signed)
She will need appt to be evaluated

## 2016-12-12 NOTE — Telephone Encounter (Signed)
Patient has been taking Otezla for approx. 2 months now. This hasn't taken care of joint pain. Wants to know if she can supplement with something else for pain. Still swelling with hands. Please advise.

## 2016-12-12 NOTE — Telephone Encounter (Signed)
Attempted to contact the patient and left message for patient to call the office.  

## 2016-12-12 NOTE — Telephone Encounter (Signed)
Patient states she started on the [place] approximately 7-8 weeks. Patients states the pain is unbearable in her knees and her hip and knees try to lock up on her. Patient states she is having pain in her hands and her fourth finger of both hands are painful and if she cliches something she has to physically straighten them back out. Patient states the third finger on both hand the knuckle is swollen. Patient states she feels like the [place]tezla is not working for her. Patient states it is working well for her Psoriasis outbreaks and the foot pain she was having.

## 2016-12-13 NOTE — Telephone Encounter (Signed)
Patient has been scheduled for an appointment on 12/28/16 at 10:30 am.

## 2016-12-16 IMAGING — DX DG CHEST 2V
2 series · 2 of 2 positions shown · non-contrast
Comparison: None.

CLINICAL DATA: Acute right-sided chest pain.

EXAM:
CHEST  2 VIEW

[chest pa]
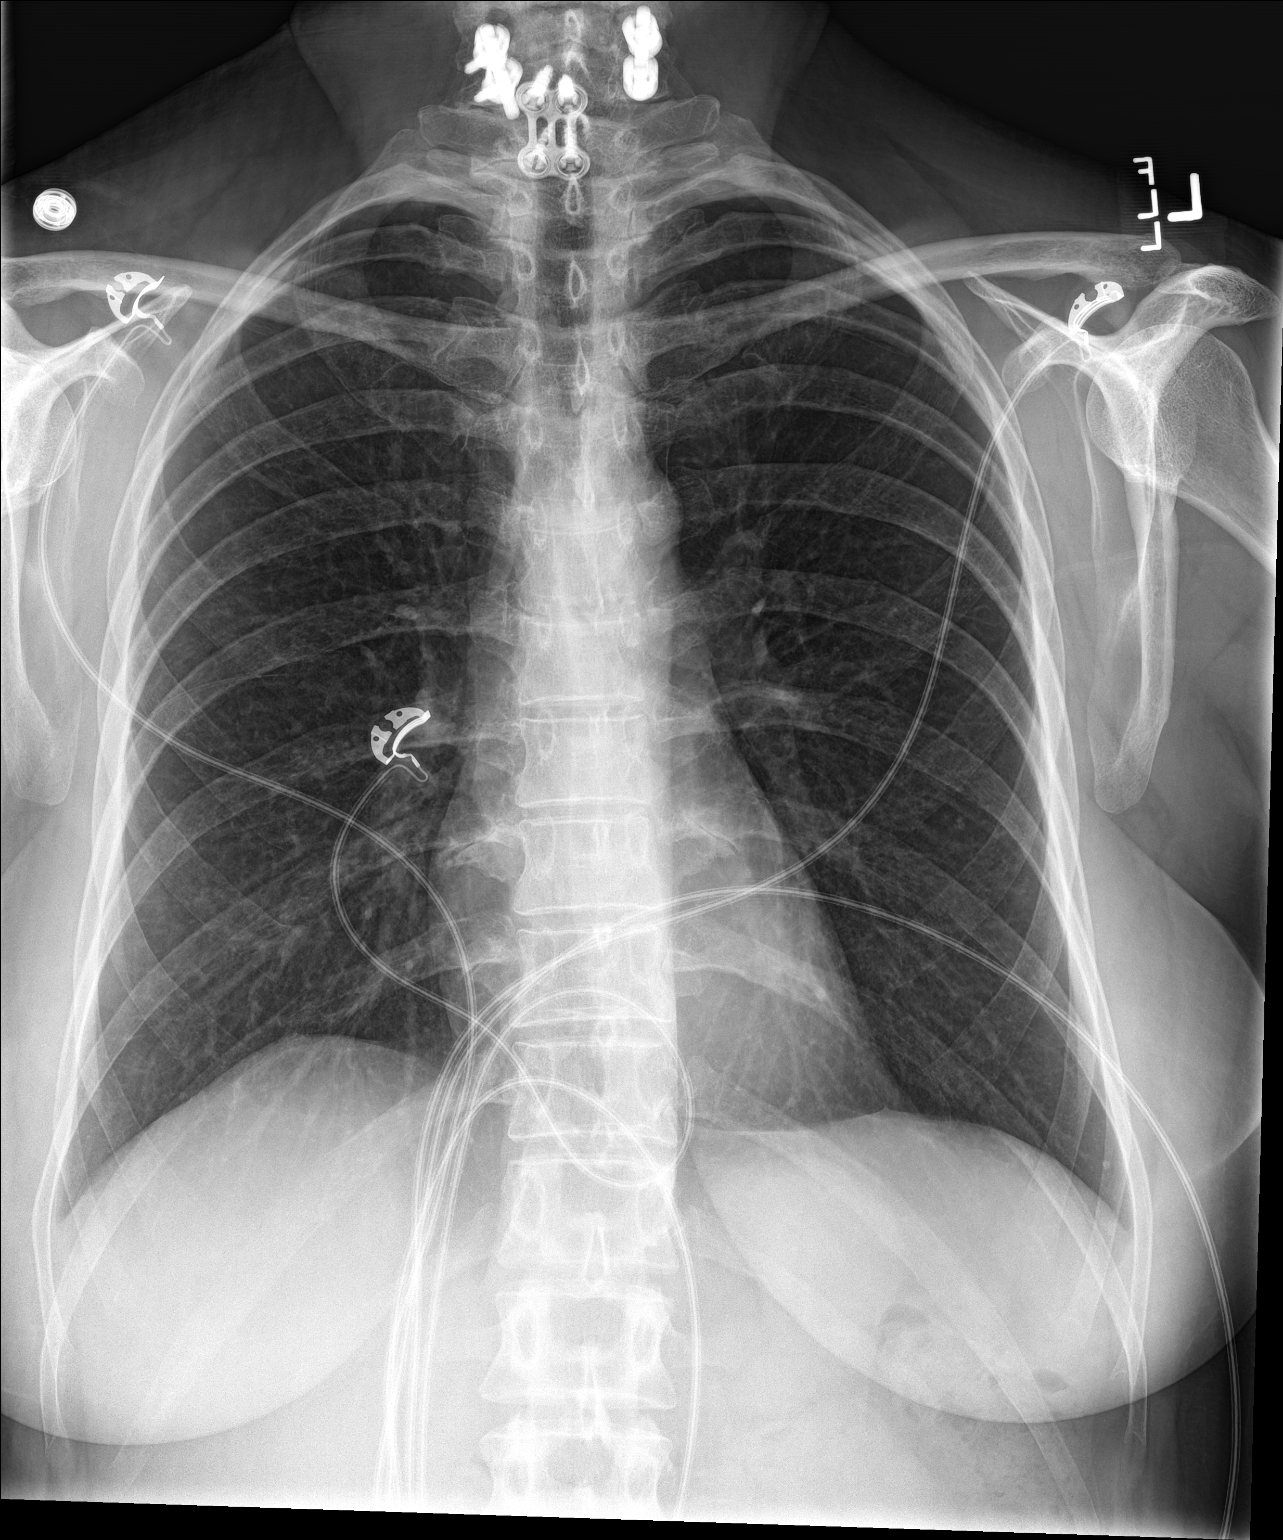

[chest lat]
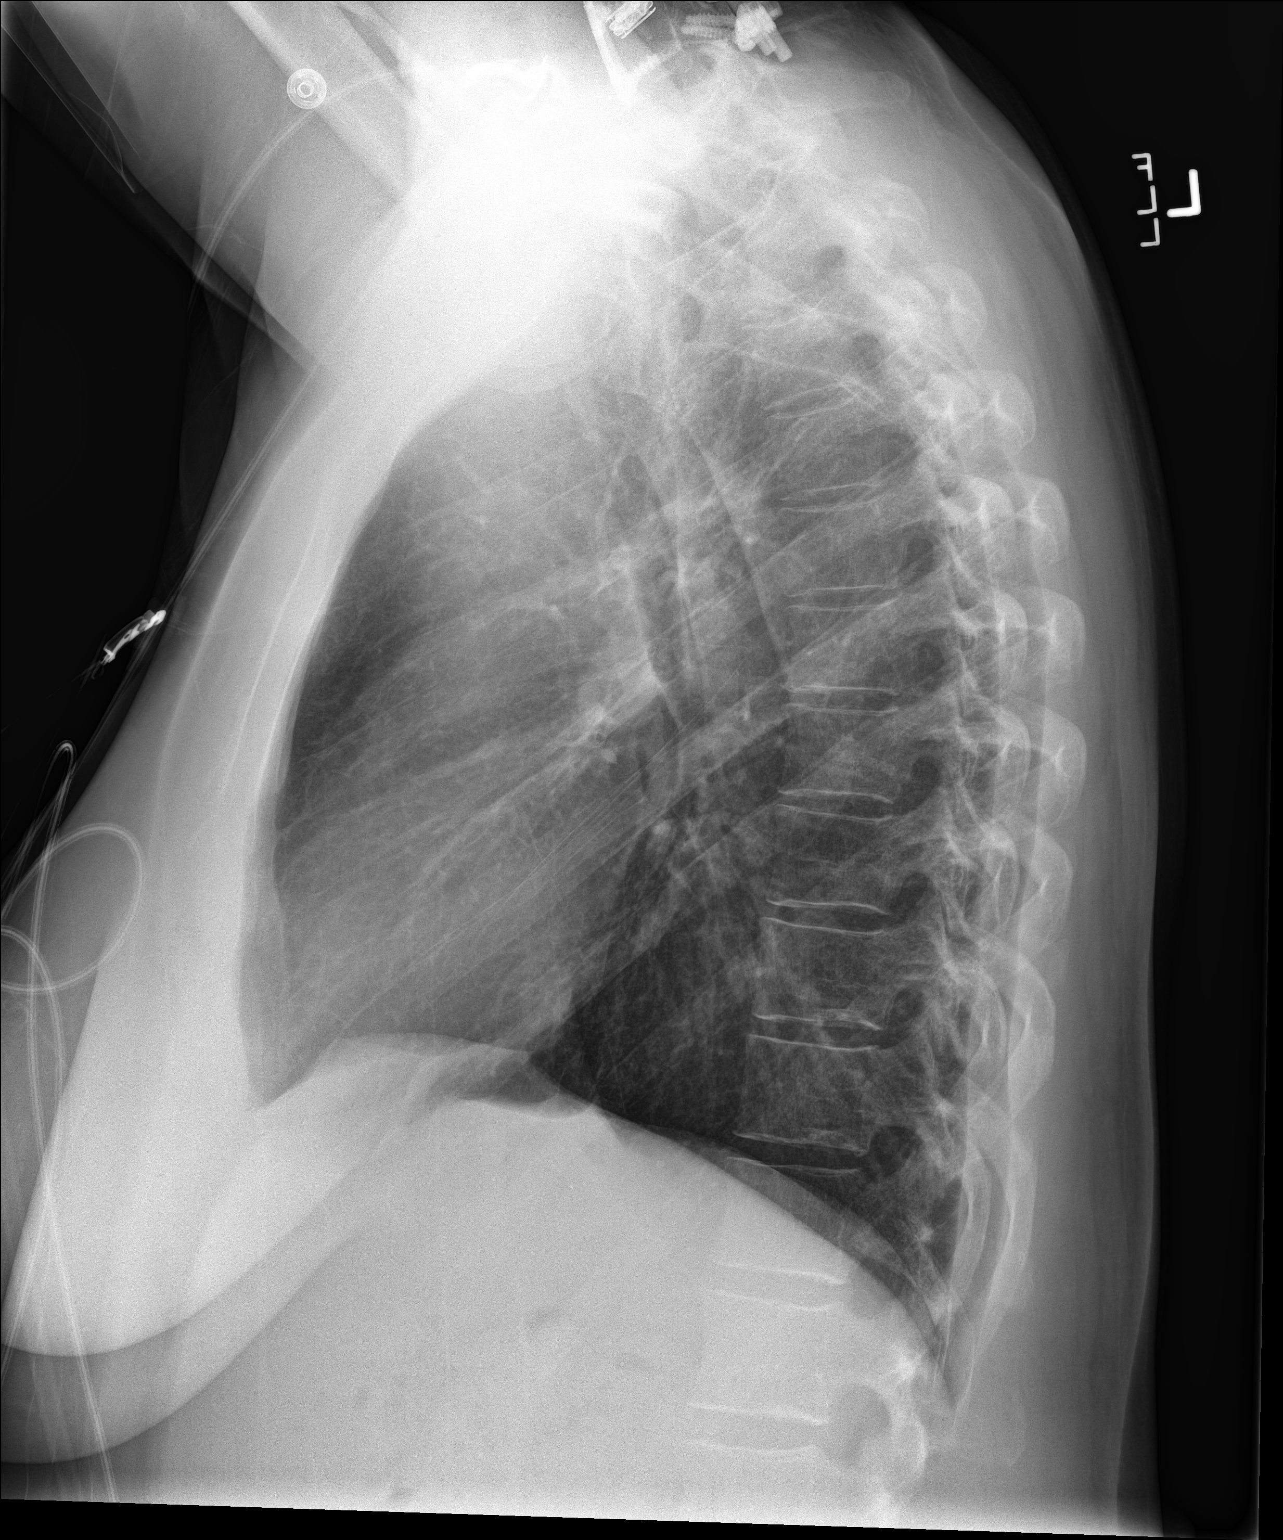

[2 of 2 positions shown; findings below may reference images not displayed]

FINDINGS: The heart size and mediastinal contours are within normal limits.
Both lungs are clear. No pneumothorax or pleural effusion is noted.
The visualized skeletal structures are unremarkable.
IMPRESSION: No active cardiopulmonary disease.

## 2016-12-20 NOTE — Progress Notes (Deleted)
Office Visit Note  Patient: Madeline Ayala             Date of Birth: 1966/07/01           MRN: [id number]             [facility] Visit Date: 12/28/2016 Occupation: @GUAROCC @    Subjective:  No chief complaint on file.   History of Present Illness: Madeline Ayala is a 51 y.o. female ***   Activities of Daily Living:  Patient reports morning stiffness for *** {minute/hour:19697}.   Patient {ACTIONS;DENIES/REPORTS:21021675::"Denies"} nocturnal pain.  Difficulty dressing/grooming: {ACTIONS;DENIES/REPORTS:21021675::"Denies"} Difficulty climbing stairs: {ACTIONS;DENIES/REPORTS:21021675::"Denies"} Difficulty getting out of chair: {ACTIONS;DENIES/REPORTS:21021675::"Denies"} Difficulty using hands for taps, buttons, cutlery, and/or writing: {ACTIONS;DENIES/REPORTS:21021675::"Denies"}   No Rheumatology ROS completed.   PMFS History:  Patient Active Problem List   Diagnosis Date Noted  . Psoriatic arthritis ([facility]) 10/29/2016  . Psoriasis 10/29/2016  . High risk medication use 10/29/2016  . Needle phobia 10/29/2016  . DDD (degenerative disc disease), lumbar 10/29/2016  . DDD (degenerative disc disease), cervical 10/29/2016  . History of scoliosis 10/29/2016  . History of asthma 10/29/2016  . History of duodenal ulcer 10/29/2016    Past Medical History:  Diagnosis Date  . Arthritis   . Asthma     No family history on file. Past Surgical History:  Procedure Laterality Date  . BACK SURGERY    . KNEE SURGERY    . TONSILLECTOMY     Social History   Social History Narrative  . No narrative on file     Objective: Vital Signs: LMP 04/10/2015    Physical Exam   Musculoskeletal Exam: ***  CDAI Exam: No CDAI exam completed.    Investigation: No additional findings.   Imaging: No results found.  Speciality Comments: No specialty comments available.  CBC    Component Value Date/Time   WBC 11.6 (H) 10/23/2016 1207   RBC 3.87 10/23/2016 1207   HGB 13.0 10/23/2016 1207   HCT 39.1 10/23/2016 1207   PLT 271 10/23/2016 1207   MCV 101.0 (H) 10/23/2016 1207   MCH 33.6 10/23/2016 1207   MCHC 33.2 10/23/2016 1207   RDW 13.2 10/23/2016 1207   LYMPHSABS 3.2 10/23/2016 1207   MONOABS 0.8 10/23/2016 1207   EOSABS 0.1 10/23/2016 1207   BASOSABS 0.0 10/23/2016 1207   CMP     Component Value Date/Time   NA 138 10/23/2016 1207   K 4.6 10/23/2016 1207   CL 103 10/23/2016 1207   CO2 29 10/23/2016 1207   GLUCOSE 80 10/23/2016 1207   BUN 13 10/23/2016 1207   CREATININE 0.94 10/23/2016 1207   CALCIUM 9.8 10/23/2016 1207   PROT 7.6 10/23/2016 1207   ALBUMIN 4.4 10/23/2016 1207   AST 19 10/23/2016 1207   ALT 17 10/23/2016 1207   ALKPHOS 70 10/23/2016 1207   BILITOT 0.4 10/23/2016 1207   GFRNONAA >60 10/23/2016 1207   GFRAA >60 10/23/2016 1207    Procedures:  No procedures performed Allergies: Azithromycin; Erythromycin; Penicillins; Sulfa antibiotics; Tetracyclines & related; Tolectin [tolmetin]; Vancomycin; Codeine; Hydrocodone; Oxycodone; Percocet [oxycodone-acetaminophen]; and Propoxyphene   Assessment / Plan:     Visit Diagnoses: Psoriatic arthritis (HCC)  Psoriasis  High risk medication use - Methotrexate 25 mg by mouth every week, folic acid 2 mg by mouth daily, otezla 30 mg by mouth twice a day  Needle phobia  DDD (degenerative disc disease), cervical  DDD (degenerative disc disease), lumbar  History of scoliosis  History of duodenal ulcer  History of asthma    Orders: No orders of the defined types were placed in this encounter.  No orders of the defined types were placed in this encounter.   Face-to-face time spent with patient was *** minutes. 50% of time was spent in counseling and coordination of care.  Follow-Up Instructions: No Follow-up on file.   Pollyann Savoy, MD  Note - This record has been created using [employer].  Chart  creation errors have been sought, but may not always  have been located. Such creation errors do not reflect on  the standard of medical care.

## 2016-12-28 ENCOUNTER — Ambulatory Visit: Payer: 59 | Admitting: Rheumatology

## 2017-01-18 ENCOUNTER — Other Ambulatory Visit: Payer: Self-pay | Admitting: Rheumatology

## 2017-01-21 NOTE — Telephone Encounter (Signed)
Last Visit: 10/30/16 Next Visit: 01/29/17  Okay to refill [place]tezla?

## 2017-01-24 ENCOUNTER — Telehealth: Payer: Self-pay

## 2017-01-24 NOTE — Telephone Encounter (Signed)
Patient called stating that she will no longer be able to get [place] on financial assistance, she will currently have a co-pay that will cost $2500.00 for 30 days.  Would like to know if there is an alternative that can be prescribed?  Please advise.  CB# is [phone].

## 2017-01-25 NOTE — Telephone Encounter (Signed)
Patient is not currently out of medication. Patient has an appointment on 01/29/17.

## 2017-01-25 NOTE — Telephone Encounter (Signed)
Spoke to Medco Health SolutionsDeloris from [place] Support to see why the patient is having a high copay for her medication. She states that the patient has a little over $400 left on her co-pay card. I gave her the patient's insurance card information and phone number Trena Platt([facility], BIN: [id number], PCN: [id number], ID: [id number], GROUPGlo Herring: [employer], PHONE NUMBER: [phone]) Deloris opened a case up to verify patients benefits and see what options she would qualify for (patient assistance, co-pay extension, etc) It will take 24 to 72 business hours before we have answer.   Is the patient out of medication?  Reference number: [id number] Phone number [place](Otezla): [phone]  Abran DukeHopkins, Luverta Korte, CPhT 10:42 AM

## 2017-01-25 NOTE — Progress Notes (Signed)
Office Visit Note  Patient: Madeline Ayala             Date of Birth: 05/31/1966           MRN: [id number]             [facility] Referring: [facility] Visit Date: 01/29/2017 Occupation: @GUAROCC @    Subjective:  Pain in hands and feet.   History of Present Illness: Madeline Ayala is a 51 y.o. female with history of psoriatic arthritis and psoriasis. She's been [place]la for 3 months now. She stopped her methotrexate that she felt it was not working. She also was having hair loss on methotrexate which is improved. She has noticed improvement in the joint swelling and her rash. She continues to have significant pain and discomfort in her hands and her feet. She states she has a right fourth and fifth trigger finger. She's been also having discomfort in her left knee. She gives history of morning stiffness in her feet lasting for about 3 hours. She denies any joint swelling. She states she would not be able to afford otezla as she did not get patient supplement. She works in a [employer] store. She states she has to type a lot. She also has to move heavy furniture and mattresses around. She has to go up and down ladders. It is becoming very difficult for her and she is pending to apply for disability.  Activities of Daily Living:  Patient reports morning stiffness for 3 hours.   Patient Reports nocturnal pain.  Difficulty dressing/grooming: Denies Difficulty climbing stairs: Reports Difficulty getting out of chair: Reports Difficulty using hands for taps, buttons, cutlery, and/or writing: Reports   No Rheumatology ROS completed.   PMFS History:  Patient Active Problem List   Diagnosis Date Noted  . Psoriatic arthritis (HCC) 10/29/2016  . Psoriasis 10/29/2016  . High risk medication use 10/29/2016  . Needle phobia 10/29/2016  . DDD (degenerative disc disease), lumbar 10/29/2016  . DDD (degenerative disc disease), cervical 10/29/2016    . History of scoliosis 10/29/2016  . History of asthma 10/29/2016  . History of duodenal ulcer 10/29/2016    Past Medical History:  Diagnosis Date  . Arthritis   . Asthma     History reviewed. No pertinent family history. Past Surgical History:  Procedure Laterality Date  . BACK SURGERY    . KNEE SURGERY    . TONSILLECTOMY     Social History   Social History Narrative  . No narrative on file     Objective: Vital Signs: Resp 14   Ht 5\' 8"  (1.727 m)   Wt 161 lb (73 kg)   LMP 04/10/2015   BMI 24.48 kg/m    Physical Exam   Musculoskeletal Exam: C-spine and thoracic lumbar spine good range of motion. Shoulder joints elbow joints wrist joint MCPs PIPs DIPs of good range of motion. She has DIP PIP thickening in her hands due to osteoarthritis. No synovitis was noted. She has some flexor tendon thickening of her right fourth and fifth finger. Hip joints knee joints ankles MTPs PIPs DIPs with good range of motion with no synovitis. She is osteoarthritic changes in her hands with DIP PIP thickening in her feet.  CDAI Exam: No CDAI exam completed.    Investigation: No additional findings. CBC Latest Ref Rng & Units 10/23/2016 02/14/2016 10/10/2015  WBC 4.0 - 10.5 K/uL 11.6(H) 11.2(H) 12.8(H)  Hemoglobin 12.0 - 15.0 g/dL 13.0  13.8 14.2  Hematocrit 36.0 - 46.0 % 39.1 40.2 42.0  Platelets 150 - 400 K/uL 271 332 276   CMP Latest Ref Rng & Units 10/23/2016 02/14/2016 10/10/2015  Glucose 65 - 99 mg/dL 80 409(W) 91  BUN 6 - 20 mg/dL 13 12 12   Creatinine 0.44 - 1.00 mg/dL 1.19 1.47(W) 2.95  Sodium 135 - 145 mmol/L 138 137 136  Potassium 3.5 - 5.1 mmol/L 4.6 3.7 4.0  Chloride 101 - 111 mmol/L 103 106 105  CO2 22 - 32 mmol/L 29 24 26   Calcium 8.9 - 10.3 mg/dL 9.8 9.5 9.0  Total Protein 6.5 - 8.1 g/dL 7.6 7.6 7.2  Total Bilirubin 0.3 - 1.2 mg/dL 0.4 0.6 0.5  Alkaline Phos 38 - 126 U/L 70 68 68  AST 15 - 41 U/L 19 23 16   ALT 14 - 54 U/L 17 17 15    Imaging: No results  found.  Speciality Comments: No specialty comments available.    Procedures:  No procedures performed Allergies: Azithromycin; Erythromycin; Penicillins; Sulfa antibiotics; Tetracyclines & related; Tolectin [tolmetin]; Vancomycin; Codeine; Hydrocodone; Oxycodone; Percocet [oxycodone-acetaminophen]; and Propoxyphene   Assessment / Plan:     Visit Diagnoses: Psoriatic arthritis (HCC): She has no synovitis on examination today. Her arthritis seems to be very well controlled on otezla. She's been off methotrexate. She's having some issues with patient assistance. She'll be working on that.  Psoriasis: She has no active psoriasis lesions on examination today.  High risk medication use - Methotrexate 10 tabs po q week (discontinued 3 months ago), Folic acid 2mg  po qd and Otezla 30 mg po bid. She is on monotherapy with otezla now. We will try to help her with the samples and patient assistance. She cannot afford any other medications.  Needle phobia  Trigger finger, right ring and little finger: Patient does not want any injections due to needle phobia. She does not want any additional prescriptions as well. I've advised her to try Mobisyl over-the-counter.  DDD (degenerative disc disease), cervical: Chronic pain  DDD (degenerative disc disease), lumbar: Chronic pain  History of scoliosis  History of duodenal ulcer  History of asthma  Smoker : Smoking cessation was discussed.  She is having increasing difficulty doing her work due to underlying osteoarthritis and disc disease. She has to lift very heavy objects and mattresses at work. I've given her a work restriction note to not lift more than 10 pounds.   Orders: No orders of the defined types were placed in this encounter.  Meds ordered this encounter  Medications  . Apremilast (OTEZLA) 30 MG TABS    Sig: Take 1 tablet by mouth 2 (two) times daily.    Dispense:  56 tablet    Refill:  0    Face-to-face time spent with  patient was 30 minutes. 50% of time was spent in counseling and coordination of care.  Follow-Up Instructions: Return in about 3 months (around 05/01/2017) for Psoriatic arthritis and psoriasis.   Pollyann Savoy, MD  Note - This record has been created using Animal nutritionist.  Chart creation errors have been sought, but may not always  have been located. Such creation errors do not reflect on  the standard of medical care.

## 2017-01-28 NOTE — Telephone Encounter (Signed)
Spoke with Madeline Ayala, a representative from [place] copay support who states that only options they could offer the patient is a co-pay extension. In order for this to happen, the patient will need to call and request it. [phone].  Reference number: [id number]  Spoke to patient to update her. She plans to give them a call. She voices understanding and denies and questions at this time.  Madeline Ayala, [place], CPhT 1:47 PM

## 2017-01-29 ENCOUNTER — Ambulatory Visit (INDEPENDENT_AMBULATORY_CARE_PROVIDER_SITE_OTHER): Payer: 59 | Admitting: Rheumatology

## 2017-01-29 ENCOUNTER — Encounter: Payer: Self-pay | Admitting: Rheumatology

## 2017-01-29 ENCOUNTER — Telehealth: Payer: Self-pay | Admitting: Pharmacist

## 2017-01-29 VITALS — Resp 14 | Ht 68.0 in | Wt 161.0 lb

## 2017-01-29 DIAGNOSIS — M65351 Trigger finger, right little finger: Secondary | ICD-10-CM

## 2017-01-29 DIAGNOSIS — F17200 Nicotine dependence, unspecified, uncomplicated: Secondary | ICD-10-CM

## 2017-01-29 DIAGNOSIS — L409 Psoriasis, unspecified: Secondary | ICD-10-CM

## 2017-01-29 DIAGNOSIS — F40298 Other specified phobia: Secondary | ICD-10-CM | POA: Diagnosis not present

## 2017-01-29 DIAGNOSIS — Z8719 Personal history of other diseases of the digestive system: Secondary | ICD-10-CM | POA: Diagnosis not present

## 2017-01-29 DIAGNOSIS — Z8739 Personal history of other diseases of the musculoskeletal system and connective tissue: Secondary | ICD-10-CM

## 2017-01-29 DIAGNOSIS — Z79899 Other long term (current) drug therapy: Secondary | ICD-10-CM

## 2017-01-29 DIAGNOSIS — M65341 Trigger finger, right ring finger: Secondary | ICD-10-CM | POA: Diagnosis not present

## 2017-01-29 DIAGNOSIS — M5136 Other intervertebral disc degeneration, lumbar region: Secondary | ICD-10-CM | POA: Diagnosis not present

## 2017-01-29 DIAGNOSIS — Z8709 Personal history of other diseases of the respiratory system: Secondary | ICD-10-CM | POA: Diagnosis not present

## 2017-01-29 DIAGNOSIS — L4050 Arthropathic psoriasis, unspecified: Secondary | ICD-10-CM

## 2017-01-29 DIAGNOSIS — M5030 Other cervical disc degeneration, unspecified cervical region: Secondary | ICD-10-CM

## 2017-01-29 MED ORDER — APREMILAST 30 MG PO TABS: 1.0000 | ORAL_TABLET | Freq: Two times a day (BID) | ORAL | 0 refills | Status: DC

## 2017-01-29 NOTE — Telephone Encounter (Signed)
I spoke to Wheeleraleb, an [place]zla representative, regarding patient's difficulty getting [place].  He advised we should try to apply for the [facility] patient assistance program to see if patient will qualify.  I called patient and informed her of that information.  She reports she will work on filling out the form and will make a copy of her financial information and will bring it by the office.    Lilla Shookachel Iridian Reader, Pharm.D., BCPS, CPP Clinical Pharmacist Pager: [phone] Phone: [phone] 01/29/2017 3:24 PM

## 2017-01-29 NOTE — Progress Notes (Signed)
Rheumatology Medication Review by a Pharmacist Does the patient feel that his/her medications are working for him/her?  Yes Has the patient been experiencing any side effects to the medications prescribed?  No Does the patient have any problems obtaining medications?  Yes  Issues to address at subsequent visits: Access to [facility]   Pharmacist comments:  Madeline Ayala is a 51 yo F who presents for follow up of psoriatic arthritis.  She is currently taking Otezla 30 mg by mouth twice daily and reports doing well on current regimen.  Patient previously submitted an adverse drug event to [facility] stating the [place] caused her to have joint pain.  Today, patient denies any adverse events with [place].  She reports she continues to have some underlying osteoarthritis, but feels the [place] is working well.  I submitted adverse event report back to [facility] with this information.    She is having trouble with insurance coverage and only has 19 days of Otezla remaining at home.  Several phone calls were made with patient during visit.  We called [facility] who confirms that patient's copay is $2485.36 for 30 day supply.  We were transferred to [facility] ([employer]) who reports copay is so high before she has $3000 deductible and still has to pay [phone] before deductible is met.  Patient does have [place] card which helps with the cost of the medication, but she only has $400 remaining on copay card at this time.  Patient confirms she talked to [place] support.  They are trying to provide patient with a copay extension so she can continue to get assistance with [place] N[place] cost.  We will follow up on whether she is approved for the copay extension.  In the meantime, I provided patient with samples of Otezla.  If Rutherford Nail copay extension is not approved, discussed trying to apply for [facility] patient assistance program.  Patient was provided with patient portion of the application in case we decide to apply for the  program.  Patient denies any further questions at this time.   Elisabeth Most, Pharm.D., BCPS, CPP Clinical Pharmacist Pager: [phone] Phone: [phone] 01/29/2017 12:04 PM

## 2017-02-01 ENCOUNTER — Telehealth: Payer: Self-pay

## 2017-02-01 ENCOUNTER — Telehealth: Payer: Self-pay | Admitting: Rheumatology

## 2017-02-01 NOTE — Telephone Encounter (Signed)
Patient came by office to drop off signed application along with income documents. The application was faxed to the assistance program. Will update once we receive a response.  Phone: [phone] Fax: [phone]  Abran DukeHopkins, Keeghan Mcintire, CPhT 10:05 AM

## 2017-02-01 NOTE — Telephone Encounter (Signed)
Mary from Patient Assistant Program left a message wanting to know why assistance was being sought for [place]la.  She did not leave a number.

## 2017-02-04 ENCOUNTER — Telehealth: Payer: Self-pay

## 2017-02-04 NOTE — Telephone Encounter (Addendum)
Spoke with representative Baxter HireKristen to inform her we submitted an application because patient ran out of funds on her co-pay coupon card. We learned earlier today  (see previous note) that patient has been approved for a co-pay extension. The patient assistance application will stay on file for one year but it will not be processed or assistance.   Reference number: [id number] Phone: [phone]  Abran DukeHopkins, Ajanae Virag, CPhT 3:40 PM

## 2017-02-04 NOTE — Telephone Encounter (Signed)
Called [place] support to check status of patients co-pay assistance extension. Spoke with Shamia who states that the patient has been approved for an extension. A letter was mailed out to patient on 6/29. It should take between 24 to 48 hours before it goes into affect.  Reference number: [id number] Phone number: [phone]  Spoke to patient to give her the update.If patient still has problems is not able to afford her co-pay we will proceed with the patient assistance application. Patient voices understanding and denied any questions at this time.    Davonna Ertl, [place], CPhT 3:07 PM

## 2017-02-28 ENCOUNTER — Other Ambulatory Visit: Payer: Self-pay | Admitting: Rheumatology

## 2017-03-01 NOTE — Telephone Encounter (Signed)
Last Visit: 01/29/17 Next Visit due in September 2018. Message sent to front to schedule patient.   Okay to refill per Dr. Corliss Skainseveshwar

## 2017-03-04 ENCOUNTER — Telehealth: Payer: Self-pay

## 2017-03-04 NOTE — Telephone Encounter (Signed)
[facility] a [facility] from [facility] requesting a prior [facility]. After reviewing the patients profile, I found an authorization on file from 10/30/16 through 10/30/2017.   Authorization number: [id number]  Phone Number: [phone]  Called [facility] and [facility] to [facility] who state that the patient covered and the authorization is still current. She ran a test claim and got a successful claim with insurance. If the pharmacy is having issues, they will have to call the pharmacy help desk.   [facility] and spoke to [name]  . She states that they also have the authorization on file. A test claim was ran and also came back as paid for $0. Patient should call pharmacy when she is ready for a refill to set up a shipment.  Called patient to update her. She voices understanding and denies any questions about her medication at this time   [place], [place], CPhT 11:45 AM

## 2017-03-09 ENCOUNTER — Other Ambulatory Visit: Payer: Self-pay | Admitting: Rheumatology

## 2017-03-11 NOTE — Telephone Encounter (Signed)
Last Visit: 01/29/17 Next Visit due in September 2018. Message sent to front to schedule patient.   Okay to refill per Dr. Corliss Skainseveshwar

## 2017-03-23 NOTE — Progress Notes (Signed)
Office Visit Note  Patient: Madeline Ayala             Date of Birth: 09/16/1965           MRN: [id number]             PCP: [facility] Referring: [facility] Visit Date: 03/26/2017 Occupation: @GUAROCC @    Subjective:  Hand stiffness.   History of Present Illness: Madeline Ayala is a 51 y.o. female with history of psoriatic arthritis and psoriasis. She states she's been doing quite well on otezla. She denies any joint swelling but she has some stiffness in her hands in the morning. She has not had a flare of psoriasis. She's been tolerating medication well. She continues to have some discomfort in her left knee joint. She states she has some nocturnal pain in her left knee. She also is having discomfort in her bilateral feet. She continues to have some lower back pain. She states she has had several surgeries in the past.  Activities of Daily Living:  Patient reports morning stiffness for 1-2 hours.   Patient Reports nocturnal pain.  Difficulty dressing/grooming: Denies Difficulty climbing stairs: Reports Difficulty getting out of chair: Reports Difficulty using hands for taps, buttons, cutlery, and/or writing: Denies   Review of Systems  Constitutional: Negative for fatigue, night sweats, weight gain, weight loss and weakness.  HENT: Negative for mouth sores, trouble swallowing, trouble swallowing, mouth dryness and nose dryness.   Eyes: Negative for pain, redness, visual disturbance and dryness.  Respiratory: Negative for cough, shortness of breath and difficulty breathing.   Cardiovascular: Negative for chest pain, palpitations, hypertension, irregular heartbeat and swelling in legs/feet.  Gastrointestinal: Negative for blood in stool, constipation and diarrhea.  Endocrine: Negative for increased urination.  Genitourinary: Negative for vaginal dryness.  Musculoskeletal: Positive for arthralgias, joint pain and morning stiffness.  Negative for joint swelling, myalgias, muscle weakness, muscle tenderness and myalgias.  Skin: Negative for color change, rash, hair loss, skin tightness, ulcers and sensitivity to sunlight.  Allergic/Immunologic: Negative for susceptible to infections.  Neurological: Negative for dizziness, memory loss and night sweats.  Hematological: Negative for swollen glands.  Psychiatric/Behavioral: Negative for depressed mood and sleep disturbance. The patient is not nervous/anxious.     PMFS History:  Patient Active Problem List   Diagnosis Date Noted  . Psoriatic arthritis (HCC) 10/29/2016  . Psoriasis 10/29/2016  . High risk medication use 10/29/2016  . Needle phobia 10/29/2016  . DDD (degenerative disc disease), lumbar 10/29/2016  . DDD (degenerative disc disease), cervical 10/29/2016  . History of scoliosis 10/29/2016  . History of asthma 10/29/2016  . History of duodenal ulcer 10/29/2016    Past Medical History:  Diagnosis Date  . Arthritis   . Asthma     No family history on file. Past Surgical History:  Procedure Laterality Date  . BACK SURGERY    . KNEE SURGERY    . TONSILLECTOMY     Social History   Social History Narrative  . No narrative on file     Objective: Vital Signs: BP 130/74   Pulse 78   Resp 16   Ht 5' 7.5" (1.715 m)   Wt 164 lb (74.4 kg)   LMP 04/10/2015   BMI 25.31 kg/m    Physical Exam  Constitutional: She is oriented to person, place, and time. She appears well-developed and well-nourished.  HENT:  Head: Normocephalic and atraumatic.  Eyes: Conjunctivae and EOM are normal.  Neck: Normal range of motion.  Cardiovascular: Normal rate, regular rhythm, normal heart sounds and intact distal pulses.   Pulmonary/Chest: Effort normal and breath sounds normal.  Abdominal: Soft. Bowel sounds are normal.  Lymphadenopathy:    She has no cervical adenopathy.  Neurological: She is alert and oriented to person, place, and time.  Skin: Skin is warm and  dry. Capillary refill takes less than 2 seconds.  Psychiatric: She has a normal mood and affect. Her behavior is normal.  Nursing note and vitals reviewed.    Musculoskeletal Exam: C-spine and thoracic lumbar spine fairly good range of motion she has discomfort range of motion. Shoulder joints elbow joints wrist joints are good range of motion. She has DIP PIP thickening in her hands and feet with no synovitis. Hip joints knee joints ankles were good range of motion with no synovitis.  CDAI Exam: CDAI Homunculus Exam:   Tenderness:  LLE: tibiofemoral  Joint Counts:  CDAI Tender Joint count: 1 CDAI Swollen Joint count: 0  Global Assessments:  Patient Global Assessment: 4 Provider Global Assessment: 4  CDAI Calculated Score: 9    Investigation: No additional findings. CBC Latest Ref Rng & Units 10/23/2016 02/14/2016 10/10/2015  WBC 4.0 - 10.5 K/uL 11.6(H) 11.2(H) 12.8(H)  Hemoglobin 12.0 - 15.0 g/dL 02.1 11.7 35.6  Hematocrit 36.0 - 46.0 % 39.1 40.2 42.0  Platelets 150 - 400 K/uL 271 332 276   CMP     Component Value Date/Time   NA 138 10/23/2016 1207   K 4.6 10/23/2016 1207   CL 103 10/23/2016 1207   CO2 29 10/23/2016 1207   GLUCOSE 80 10/23/2016 1207   BUN 13 10/23/2016 1207   CREATININE 0.94 10/23/2016 1207   CALCIUM 9.8 10/23/2016 1207   PROT 7.6 10/23/2016 1207   ALBUMIN 4.4 10/23/2016 1207   AST 19 10/23/2016 1207   ALT 17 10/23/2016 1207   ALKPHOS 70 10/23/2016 1207   BILITOT 0.4 10/23/2016 1207   GFRNONAA >60 10/23/2016 1207   GFRAA >60 10/23/2016 1207    Imaging: No results found.  Speciality Comments: No specialty comments available.    Procedures:  No procedures performed Allergies: Azithromycin; Erythromycin; Penicillins; Sulfa antibiotics; Tetracyclines & related; Tolectin [tolmetin]; Vancomycin; Codeine; Hydrocodone; Oxycodone; Percocet [oxycodone-acetaminophen]; and Propoxyphene   Assessment / Plan:     Visit Diagnoses: Psoriatic arthritis  (HCC): She has no active synovitis on examination she appears to be doing quite well [place]. She gives history of significant morning stiffness and some discomfort. I offered x-rays of her bilateral hands and feet today which she declined she would like to wait until next visit.  Psoriasis: She has no active lesions.  High risk medication use - Otezla 30 mg po bid.( methotrexate was discontinued in March 2018 by patient.)  Primary osteoarthritis of both hands: Which causes some stiffness.  Needle phobia  DDD (degenerative disc disease), cervical: She has some stiffness.  DDD (degenerative disc disease), lumbar -chronic pain.  History of scoliosis  History of duodenal ulcer  History of asthma  Smoker    Orders: No orders of the defined types were placed in this encounter.  No orders of the defined types were placed in this encounter.     Follow-Up Instructions: Return in about 6 months (around 09/26/2017) for Psoriatic arthritis, Osteoarthritis.   Pollyann Savoy, MD  Note - This record has been created using Animal nutritionist.  Chart creation errors have been sought, but may not always  have been located. Such creation  errors do not reflect on  the standard of medical care.

## 2017-03-26 ENCOUNTER — Encounter: Payer: Self-pay | Admitting: Rheumatology

## 2017-03-26 ENCOUNTER — Ambulatory Visit (INDEPENDENT_AMBULATORY_CARE_PROVIDER_SITE_OTHER): Payer: 59 | Admitting: Rheumatology

## 2017-03-26 VITALS — BP 130/74 | HR 78 | Resp 16 | Ht 67.5 in | Wt 164.0 lb

## 2017-03-26 DIAGNOSIS — F40298 Other specified phobia: Secondary | ICD-10-CM | POA: Diagnosis not present

## 2017-03-26 DIAGNOSIS — M51369 Other intervertebral disc degeneration, lumbar region without mention of lumbar back pain or lower extremity pain: Secondary | ICD-10-CM

## 2017-03-26 DIAGNOSIS — Z8719 Personal history of other diseases of the digestive system: Secondary | ICD-10-CM

## 2017-03-26 DIAGNOSIS — Z8709 Personal history of other diseases of the respiratory system: Secondary | ICD-10-CM | POA: Diagnosis not present

## 2017-03-26 DIAGNOSIS — L4050 Arthropathic psoriasis, unspecified: Secondary | ICD-10-CM | POA: Diagnosis not present

## 2017-03-26 DIAGNOSIS — M5030 Other cervical disc degeneration, unspecified cervical region: Secondary | ICD-10-CM

## 2017-03-26 DIAGNOSIS — M19041 Primary osteoarthritis, right hand: Secondary | ICD-10-CM | POA: Diagnosis not present

## 2017-03-26 DIAGNOSIS — Z8739 Personal history of other diseases of the musculoskeletal system and connective tissue: Secondary | ICD-10-CM

## 2017-03-26 DIAGNOSIS — Z79899 Other long term (current) drug therapy: Secondary | ICD-10-CM

## 2017-03-26 DIAGNOSIS — L409 Psoriasis, unspecified: Secondary | ICD-10-CM

## 2017-03-26 DIAGNOSIS — F17200 Nicotine dependence, unspecified, uncomplicated: Secondary | ICD-10-CM

## 2017-03-26 DIAGNOSIS — M5136 Other intervertebral disc degeneration, lumbar region: Secondary | ICD-10-CM | POA: Diagnosis not present

## 2017-03-26 DIAGNOSIS — M19042 Primary osteoarthritis, left hand: Secondary | ICD-10-CM

## 2017-03-26 NOTE — Progress Notes (Signed)
Rheumatology Medication Review by a Pharmacist Does the patient feel that his/her medications are working for him/her?  Yes Has the patient been experiencing any side effects to the medications prescribed?  No Does the patient have any problems obtaining medications?  No  Issues to address at subsequent visits: None   Pharmacist comments:  Madeline Ayala is a 51 yo F who presents for follow up of psoriatic arthritis.  She is currently taking Otezla 30 mg by mouth twice daily and reports doing well on current regimen.  Patient denies any questions or concerns regarding her medications at this time.    Lilla Shook, Pharm.D., BCPS, CPP Clinical Pharmacist Pager: [phone] Phone: [phone] 03/26/2017 9:05 AM

## 2017-05-10 ENCOUNTER — Telehealth: Payer: Self-pay | Admitting: Rheumatology

## 2017-05-10 MED ORDER — APREMILAST 30 MG PO TABS: 1.0000 | ORAL_TABLET | Freq: Two times a day (BID) | ORAL | 0 refills | Status: DC

## 2017-05-10 NOTE — Telephone Encounter (Signed)
Last Visit: 03/26/17 Next Visit in February 2019. Message sent to the front to schedule patient.   Okay to refill per Dr. Corliss Skains

## 2017-05-10 NOTE — Telephone Encounter (Signed)
Patient states needs refill on Otezla. Patient uses [facility] would be faster per [facility].

## 2017-06-04 ENCOUNTER — Other Ambulatory Visit: Payer: Self-pay | Admitting: Rheumatology

## 2017-06-04 NOTE — Telephone Encounter (Signed)
Last Visit: 03/26/17 Next Visit: 10/07/17  Okay to refill per Dr. Deveshwar        

## 2017-06-07 ENCOUNTER — Telehealth: Payer: Self-pay

## 2017-06-07 NOTE — Telephone Encounter (Signed)
Returned patient's call. She states that she has exhausted her co-pay assistance amount for her Rx and her co-pay is too expensive without it. She thinks she will not be eligible for more funds until the beginning of 2019. She has enough medications to last until the end of next week. She plans to call the copay assistance to see if she can get another extension. Can we give her a sample? Thanks  Adalyna Godbee, [place], CPhT 4:35 PM

## 2017-06-07 NOTE — Telephone Encounter (Signed)
Patient was calling concerning her co-pay for [facility].  Stated that she called billing and left a message.  Not sure if [place] can help with this medication.  CB# [phone].  Please advise.  Thank you.

## 2017-06-10 NOTE — Telephone Encounter (Signed)
Last Visit: 03/26/17 Next Visit: 10/07/17  Okay to give sample of Otezla?

## 2017-06-10 NOTE — Telephone Encounter (Signed)
ok 

## 2017-06-11 ENCOUNTER — Telehealth: Payer: Self-pay

## 2017-06-11 NOTE — Telephone Encounter (Signed)
Left message to advise patient she may come to the office to pick up samples of Otezla.

## 2017-06-11 NOTE — Telephone Encounter (Signed)
FYI Patient will come to pick up samples of Otezla on Thursday.

## 2017-06-13 ENCOUNTER — Telehealth: Payer: Self-pay

## 2017-06-13 NOTE — Telephone Encounter (Signed)
Medication Samples have been provided to the patient.  Drug name: Henderson [place]Otezla       Strength: 30mg        Qty: 112  LOT: [id number]  Exp.Date: 02/2018  Dosing instructions: Take one tablet by mouth twice daily.   The patient has been instructed regarding the correct time, dose, and frequency of taking this medication, including desired effects and most common side effects.   Essie HartJulie E Saunders 3:32 PM 06/13/2017

## 2017-06-13 NOTE — Telephone Encounter (Signed)
Patient came by clinic to get her samples. She states that she has enrolled in a new insurance plan that will start August 06, 2017. It will cover the Otezla at 100% after her deductible.   She was given samples by the pharmacists.   John Vasconcelos, [place], CPhT 3:29 PM

## 2017-07-23 ENCOUNTER — Other Ambulatory Visit: Payer: Self-pay | Admitting: Rheumatology

## 2017-07-23 NOTE — Telephone Encounter (Signed)
Last Visit: 03/26/17 Next Visit: 10/07/17  Okay to refill per Dr. Corliss Skainseveshwar

## 2017-09-20 ENCOUNTER — Telehealth: Payer: Self-pay | Admitting: Rheumatology

## 2017-09-20 NOTE — Telephone Encounter (Signed)
Received a coinfirmation from cover my meds regarding a prior authorization approval for OTEZLA  from 09/20/2017 to 08/05/2038.   Reference number:NONE Phone number:NONE  Will send document to scan center once received.  Called pt to update. Pt voice understanding and denies any questions at this time.  Hillari Zumwalt, [place], CPhT 4:09 PM

## 2017-09-20 NOTE — Telephone Encounter (Signed)
Called pts insurance ([employer]) to clarify drug coverage. Spoke with Molly Maduroobert who states that a prior authorization is required and [facility] preferred [facility] through her plan.   Submitted a prior auth to pts insurance via cover my meds. Will update once we receive a response.   Called pt to update. She states she has about one week of meds left and some samples from the clinic. We will contact pt once we have a response from insurance. Pt voices understanding and denies any questions at this time.   Jaelani Posa, [place], CPhT 2:33 PM

## 2017-09-20 NOTE — Telephone Encounter (Signed)
[facility]. Patient states her new insurance, [employer] covers rx, but [facility] is telling her it"s not covered, and she needs generic. Please call to discuss. Patient has spoken with insurance to verify rx coverage.

## 2017-09-23 NOTE — Progress Notes (Signed)
Office Visit Note  Patient: Madeline Ayala             Date of Birth: [birth year]           MRN: [id number]             [facility] Visit Date: 10/07/2017 Occupation: @GUAROCC @    Subjective:  Joint stiffness.   History of Present Illness: Madeline Paresawnya Solimine is a 52 y.o. female with history of psoriatic arthritis and psoriasis.  She states she has been having some discomfort in her right shoulder.  She believes is related to her work.  She has been also having some discomfort in the bottom of her feet.  She denies any joint swelling.  Psoriasis is well controlled.  She has been having increased pain in her left knee joint.    Activities of Daily Living:  Patient reports morning stiffness for 20 minutes.   Patient Denies nocturnal pain.  Difficulty dressing/grooming: Reports Difficulty climbing stairs: Reports Difficulty getting out of chair: Reports Difficulty using hands for taps, buttons, cutlery, and/or writing: Reports   Review of Systems  Constitutional: Positive for fatigue. Negative for night sweats.  HENT: Negative for mouth dryness.   Eyes: Negative for dryness.  Respiratory: Negative for shortness of breath.   Cardiovascular: Negative for swelling in legs/feet.  Gastrointestinal: Negative for nausea.  Endocrine: Negative for heat intolerance.  Genitourinary: Negative for pelvic pain.  Musculoskeletal: Positive for morning stiffness.  Skin: Negative for hair loss and redness.  Allergic/Immunologic: Negative for susceptible to infections.  Neurological: Negative for dizziness, headaches, memory loss and night sweats.  Hematological: Negative for bruising/bleeding tendency.  Psychiatric/Behavioral: The patient is not nervous/anxious.     PMFS History:  Patient Active Problem List   Diagnosis Date Noted  . Psoriatic arthritis (HCC) 10/29/2016  . Psoriasis 10/29/2016  . High risk medication use  10/29/2016  . Needle phobia 10/29/2016  . DDD (degenerative disc disease), lumbar 10/29/2016  . DDD (degenerative disc disease), cervical 10/29/2016  . History of scoliosis 10/29/2016  . History of asthma 10/29/2016  . History of duodenal ulcer 10/29/2016    Past Medical History:  Diagnosis Date  . Arthritis   . Asthma     Family History  Problem Relation Age of Onset  . Diabetes Mother   . COPD Mother   . Spina bifida Mother   . Thyroid disease Mother   . Scoliosis Mother   . Heart disease Mother   . Macular degeneration Father   . Skin cancer Father        on ear  . Depression Brother   . Bipolar disorder Brother   . Depression Daughter   . Post-traumatic stress disorder Daughter    Past Surgical History:  Procedure Laterality Date  . BACK SURGERY    . KNEE SURGERY    . TONSILLECTOMY     Social History   Social History Narrative  . Not on file     Objective: Vital Signs: BP 102/65 (BP Location: Left Arm, Patient Position: Sitting, Cuff Size: Normal)   Pulse (!) 55   Resp 16   Ht 5\' 8"  (1.727 m)   Wt 163 lb (73.9 kg)   LMP 04/10/2015   BMI 24.78 kg/m    Physical Exam  Constitutional: She is oriented to person, place, and time. She appears well-developed and well-nourished.  HENT:  Head: Normocephalic and atraumatic.  Eyes: Conjunctivae and EOM are  normal.  Neck: Normal range of motion.  Cardiovascular: Normal rate, regular rhythm, normal heart sounds and intact distal pulses.  Pulmonary/Chest: Effort normal and breath sounds normal.  Abdominal: Soft. Bowel sounds are normal.  Lymphadenopathy:    She has no cervical adenopathy.  Neurological: She is alert and oriented to person, place, and time.  Skin: Skin is warm and dry. Capillary refill takes less than 2 seconds.  Psychiatric: She has a normal mood and affect. Her behavior is normal.  Nursing note and vitals reviewed.    Musculoskeletal Exam: C-spine thoracic lumbar spine good range of motion.   She had discomfort range of motion of her right shoulder especially with internal rotation.  Elbow joints wrist joint MCPs PIPs DIPs with good range of motion.  She has no synovitis or PIP/DIP joints.  Hip joints knee joints ankles MTPs PIPs with good range of motion.  She has some warmth in her left knee joint without any swelling or effusion.  CDAI Exam: CDAI Homunculus Exam:   Joint Counts:  CDAI Tender Joint count: 0 CDAI Swollen Joint count: 0  Global Assessments:  Patient Global Assessment: 4 Provider Global Assessment: 2  CDAI Calculated Score: 6    Investigation: No additional findings. CBC Latest Ref Rng & Units 10/23/2016 02/14/2016 10/10/2015  WBC 4.0 - 10.5 K/uL 11.6(H) 11.2(H) 12.8(H)  Hemoglobin 12.0 - 15.0 g/dL 40.9 81.1 91.4  Hematocrit 36.0 - 46.0 % 39.1 40.2 42.0  Platelets 150 - 400 K/uL 271 332 276   CMP Latest Ref Rng & Units 10/23/2016 02/14/2016 10/10/2015  Glucose 65 - 99 mg/dL 80 782(N) 91  BUN 6 - 20 mg/dL 13 12 12   Creatinine 0.44 - 1.00 mg/dL 5.62 1.30(Q) 6.57  Sodium 135 - 145 mmol/L 138 137 136  Potassium 3.5 - 5.1 mmol/L 4.6 3.7 4.0  Chloride 101 - 111 mmol/L 103 106 105  CO2 22 - 32 mmol/L 29 24 26   Calcium 8.9 - 10.3 mg/dL 9.8 9.5 9.0  Total Protein 6.5 - 8.1 g/dL 7.6 7.6 7.2  Total Bilirubin 0.3 - 1.2 mg/dL 0.4 0.6 0.5  Alkaline Phos 38 - 126 U/L 70 68 68  AST 15 - 41 U/L 19 23 16   ALT 14 - 54 U/L 17 17 15     Imaging: No results found.  Speciality Comments: No specialty comments available.    Procedures:  No procedures performed Allergies: Azithromycin; Erythromycin; Other; Penicillins; Propoxyphene; Sulfa antibiotics; Tetracyclines & related; Tolectin [tolmetin]; Vancomycin; Codeine; Hydrocodone; Oxycodone; and Percocet [oxycodone-acetaminophen]   Assessment / Plan:     Visit Diagnoses: Psoriatic arthritis (HCC): Her arthritis seems to be quite well controlled with [place].  She had no active synovitis on examination.  Psoriasis: She  had no active skin lesions.  High risk medication use - Otezla 30 mg po bid.( methotrexate was discontinued in March 2018 by patient.)  Primary osteoarthritis of both hands: She has some stiffness in her hands but no swelling.  Acute pain of right shoulder: Patient believes is related to her work.  She has had problems with left rotator cuff in the past.  I have given her a handout on exercises.  If her symptoms persist she is supposed to notify us.  Chronic pain of left knee: She has some warmth in her left knee joint.  I offered x-ray and evaluation but she would prefer to wait at this point.  I have given her a handout on knee exercises.  DDD (degenerative disc disease), cervical chronic pain  DDD (degenerative disc disease), lumbar: Chronic pain Needle phobia  History of asthma  History of duodenal ulcer  History of scoliosis  Smoker: Smoking cessation was discussed.   Orders: No orders of the defined types were placed in this encounter.  Meds ordered this encounter  Medications  . Apremilast (OTEZLA) 30 MG TABS    Sig: Take 1 tablet by mouth 2 (two) times daily.    Dispense:  180 tablet    Refill:  0    Face-to-face time spent with patient was 30 minutes.  Greater than 50% of time was spent in counseling and coordination of care.  Follow-Up Instructions: Return in about 6 months (around 04/09/2018) for Psoriatic arthritis, psoriasis, DDD.   Pollyann Savoy, MD  Note - This record has been created using Animal nutritionist.  Chart creation errors have been sought, but may not always  have been located. Such creation errors do not reflect on  the standard of medical care.

## 2017-10-07 ENCOUNTER — Ambulatory Visit (INDEPENDENT_AMBULATORY_CARE_PROVIDER_SITE_OTHER): Payer: BLUE CROSS/BLUE SHIELD | Admitting: Rheumatology

## 2017-10-07 ENCOUNTER — Encounter: Payer: Self-pay | Admitting: Rheumatology

## 2017-10-07 VITALS — BP 102/65 | HR 55 | Resp 16 | Ht 68.0 in | Wt 163.0 lb

## 2017-10-07 DIAGNOSIS — Z79899 Other long term (current) drug therapy: Secondary | ICD-10-CM | POA: Diagnosis not present

## 2017-10-07 DIAGNOSIS — Z8719 Personal history of other diseases of the digestive system: Secondary | ICD-10-CM

## 2017-10-07 DIAGNOSIS — L4050 Arthropathic psoriasis, unspecified: Secondary | ICD-10-CM

## 2017-10-07 DIAGNOSIS — M19041 Primary osteoarthritis, right hand: Secondary | ICD-10-CM

## 2017-10-07 DIAGNOSIS — M5030 Other cervical disc degeneration, unspecified cervical region: Secondary | ICD-10-CM | POA: Diagnosis not present

## 2017-10-07 DIAGNOSIS — F40298 Other specified phobia: Secondary | ICD-10-CM | POA: Diagnosis not present

## 2017-10-07 DIAGNOSIS — L409 Psoriasis, unspecified: Secondary | ICD-10-CM | POA: Diagnosis not present

## 2017-10-07 DIAGNOSIS — Z8739 Personal history of other diseases of the musculoskeletal system and connective tissue: Secondary | ICD-10-CM

## 2017-10-07 DIAGNOSIS — M19042 Primary osteoarthritis, left hand: Secondary | ICD-10-CM

## 2017-10-07 DIAGNOSIS — G8929 Other chronic pain: Secondary | ICD-10-CM

## 2017-10-07 DIAGNOSIS — F17200 Nicotine dependence, unspecified, uncomplicated: Secondary | ICD-10-CM

## 2017-10-07 DIAGNOSIS — M5136 Other intervertebral disc degeneration, lumbar region: Secondary | ICD-10-CM | POA: Diagnosis not present

## 2017-10-07 DIAGNOSIS — Z8709 Personal history of other diseases of the respiratory system: Secondary | ICD-10-CM

## 2017-10-07 DIAGNOSIS — M25562 Pain in left knee: Secondary | ICD-10-CM

## 2017-10-07 DIAGNOSIS — M51369 Other intervertebral disc degeneration, lumbar region without mention of lumbar back pain or lower extremity pain: Secondary | ICD-10-CM

## 2017-10-07 DIAGNOSIS — M25511 Pain in right shoulder: Secondary | ICD-10-CM | POA: Diagnosis not present

## 2017-10-07 MED ORDER — APREMILAST 30 MG PO TABS: 1.0000 | ORAL_TABLET | Freq: Two times a day (BID) | ORAL | 0 refills | Status: DC

## 2017-10-07 NOTE — Patient Instructions (Addendum)
Shoulder Exercises Ask your health care provider which exercises are safe for you. Do exercises exactly as told by your health care provider and adjust them as directed. It is normal to feel mild stretching, pulling, tightness, or discomfort as you do these exercises, but you should stop right away if you feel sudden pain or your pain gets worse.Do not begin these exercises until told by your health care provider. RANGE OF MOTION EXERCISES These exercises warm up your muscles and joints and improve the movement and flexibility of your shoulder. These exercises also help to relieve pain, numbness, and tingling. These exercises involve stretching your injured shoulder directly. Exercise A: Pendulum  1. Stand near a wall or a surface that you can hold onto for balance. 2. Bend at the waist and let your left / right arm hang straight down. Use your other arm to support you. Keep your back straight and do not lock your knees. 3. Relax your left / right arm and shoulder muscles, and move your hips and your trunk so your left / right arm swings freely. Your arm should swing because of the motion of your body, not because you are using your arm or shoulder muscles. 4. Keep moving your body so your arm swings in the following directions, as told by your health care provider: ? Side to side. ? Forward and backward. ? In clockwise and counterclockwise circles. 5. Continue each motion for __________ seconds, or for as long as told by your health care provider. 6. Slowly return to the starting position. Repeat __________ times. Complete this exercise __________ times a day. Exercise B:Flexion, Standing  1. Stand and hold a broomstick, a cane, or a similar object. Place your hands a little more than shoulder-width apart on the object. Your left / right hand should be palm-up, and your other hand should be palm-down. 2. Keep your elbow straight and keep your shoulder muscles relaxed. Push the stick down with  your healthy arm to raise your left / right arm in front of your body, and then over your head until you feel a stretch in your shoulder. ? Avoid shrugging your shoulder while you raise your arm. Keep your shoulder blade tucked down toward the middle of your back. 3. Hold for __________ seconds. 4. Slowly return to the starting position. Repeat __________ times. Complete this exercise __________ times a day. Exercise C: Abduction, Standing 1. Stand and hold a broomstick, a cane, or a similar object. Place your hands a little more than shoulder-width apart on the object. Your left / right hand should be palm-up, and your other hand should be palm-down. 2. While keeping your elbow straight and your shoulder muscles relaxed, push the stick across your body toward your left / right side. Raise your left / right arm to the side of your body and then over your head until you feel a stretch in your shoulder. ? Do not raise your arm above shoulder height, unless your health care provider tells you to do that. ? Avoid shrugging your shoulder while you raise your arm. Keep your shoulder blade tucked down toward the middle of your back. 3. Hold for __________ seconds. 4. Slowly return to the starting position. Repeat __________ times. Complete this exercise __________ times a day. Exercise D:Internal Rotation  1. Place your left / right hand behind your back, palm-up. 2. Use your other hand to dangle an exercise band, a towel, or a similar object over your shoulder. Grasp the band with   your left / right hand so you are holding onto both ends. 3. Gently pull up on the band until you feel a stretch in the front of your left / right shoulder. ? Avoid shrugging your shoulder while you raise your arm. Keep your shoulder blade tucked down toward the middle of your back. 4. Hold for __________ seconds. 5. Release the stretch by letting go of the band and lowering your hands. Repeat __________ times. Complete  this exercise __________ times a day. STRETCHING EXERCISES These exercises warm up your muscles and joints and improve the movement and flexibility of your shoulder. These exercises also help to relieve pain, numbness, and tingling. These exercises are done using your healthy shoulder to help stretch the muscles of your injured shoulder. Exercise E: Corner Stretch (External Rotation and Abduction)  1. Stand in a doorway with one of your feet slightly in front of the other. This is called a staggered stance. If you cannot reach your forearms to the door frame, stand facing a corner of a room. 2. Choose one of the following positions as told by your health care provider: ? Place your hands and forearms on the door frame above your head. ? Place your hands and forearms on the door frame at the height of your head. ? Place your hands on the door frame at the height of your elbows. 3. Slowly move your weight onto your front foot until you feel a stretch across your chest and in the front of your shoulders. Keep your head and chest upright and keep your abdominal muscles tight. 4. Hold for __________ seconds. 5. To release the stretch, shift your weight to your back foot. Repeat __________ times. Complete this stretch __________ times a day. Exercise F:Extension, Standing 1. Stand and hold a broomstick, a cane, or a similar object behind your back. ? Your hands should be a little wider than shoulder-width apart. ? Your palms should face away from your back. 2. Keeping your elbows straight and keeping your shoulder muscles relaxed, move the stick away from your body until you feel a stretch in your shoulder. ? Avoid shrugging your shoulders while you move the stick. Keep your shoulder blade tucked down toward the middle of your back. 3. Hold for __________ seconds. 4. Slowly return to the starting position. Repeat __________ times. Complete this exercise __________ times a day. STRENGTHENING  EXERCISES These exercises build strength and endurance in your shoulder. Endurance is the ability to use your muscles for a long time, even after they get tired. Exercise G:External Rotation  1. Sit in a stable chair without armrests. 2. Secure an exercise band at elbow height on your left / right side. 3. Place a soft object, such as a folded towel or a small pillow, between your left / right upper arm and your body to move your elbow a few inches away (about 10 cm) from your side. 4. Hold the end of the band so it is tight and there is no slack. 5. Keeping your elbow pressed against the soft object, move your left / right forearm out, away from your abdomen. Keep your body steady so only your forearm moves. 6. Hold for __________ seconds. 7. Slowly return to the starting position. Repeat __________ times. Complete this exercise __________ times a day. Exercise H:Shoulder Abduction  1. Sit in a stable chair without armrests, or stand. 2. Hold a __________ weight in your left / right hand, or hold an exercise band with both hands.   3. Start with your arms straight down and your left / right palm facing in, toward your body. 4. Slowly lift your left / right hand out to your side. Do not lift your hand above shoulder height unless your health care provider tells you that this is safe. ? Keep your arms straight. ? Avoid shrugging your shoulder while you do this movement. Keep your shoulder blade tucked down toward the middle of your back. 5. Hold for __________ seconds. 6. Slowly lower your arm, and return to the starting position. Repeat __________ times. Complete this exercise __________ times a day. Exercise I:Shoulder Extension 1. Sit in a stable chair without armrests, or stand. 2. Secure an exercise band to a stable object in front of you where it is at shoulder height. 3. Hold one end of the exercise band in each hand. Your palms should face each other. 4. Straighten your elbows and  lift your hands up to shoulder height. 5. Step back, away from the secured end of the exercise band, until the band is tight and there is no slack. 6. Squeeze your shoulder blades together as you pull your hands down to the sides of your thighs. Stop when your hands are straight down by your sides. Do not let your hands go behind your body. 7. Hold for __________ seconds. 8. Slowly return to the starting position. Repeat __________ times. Complete this exercise __________ times a day. Exercise J:Standing Shoulder Row 1. Sit in a stable chair without armrests, or stand. 2. Secure an exercise band to a stable object in front of you so it is at waist height. 3. Hold one end of the exercise band in each hand. Your palms should be in a thumbs-up position. 4. Bend each of your elbows to an "L" shape (about 90 degrees) and keep your upper arms at your sides. 5. Step back until the band is tight and there is no slack. 6. Slowly pull your elbows back behind you. 7. Hold for __________ seconds. 8. Slowly return to the starting position. Repeat __________ times. Complete this exercise __________ times a day. Exercise K:Shoulder Press-Ups  1. Sit in a stable chair that has armrests. Sit upright, with your feet flat on the floor. 2. Put your hands on the armrests so your elbows are bent and your fingers are pointing forward. Your hands should be about even with the sides of your body. 3. Push down on the armrests and use your arms to lift yourself off of the chair. Straighten your elbows and lift yourself up as much as you comfortably can. ? Move your shoulder blades down, and avoid letting your shoulders move up toward your ears. ? Keep your feet on the ground. As you get stronger, your feet should support less of your body weight as you lift yourself up. 4. Hold for __________ seconds. 5. Slowly lower yourself back into the chair. Repeat __________ times. Complete this exercise __________ times a  day. Exercise L: Wall Push-Ups  1. Stand so you are facing a stable wall. Your feet should be about one arm-length away from the wall. 2. Lean forward and place your palms on the wall at shoulder height. 3. Keep your feet flat on the floor as you bend your elbows and lean forward toward the wall. 4. Hold for __________ seconds. 5. Straighten your elbows to push yourself back to the starting position. Repeat __________ times. Complete this exercise __________ times a day. This information is not intended to replace advice   given to you by your health care provider. Make sure you discuss any questions you have with your health care provider. Document Released: 06/06/2005 Document Revised: 04/16/2016 Document Reviewed: 04/03/2015 [employer] Interactive Patient Education  2018 [employer] Knee Exercises Ask your health care provider which exercises are safe for you. Do exercises exactly as told by your health care provider and adjust them as directed. It is normal to feel mild stretching, pulling, tightness, or discomfort as you do these exercises, but you should stop right away if you feel sudden pain or your pain gets worse.Do not begin these exercises until told by your health care provider. STRETCHING AND RANGE OF MOTION EXERCISES These exercises warm up your muscles and joints and improve the movement and flexibility of your knee. These exercises also help to relieve pain, numbness, and tingling. Exercise A: Knee Extension, Prone 1. Lie on your abdomen on a bed. 2. Place your left / right knee just beyond the edge of the surface so your knee is not on the bed. You can put a towel under your left / right thigh just above your knee for comfort. 3. Relax your leg muscles and allow gravity to straighten your knee. You should feel a stretch behind your left / right knee. 4. Hold this position for __________ seconds. 5. Scoot up so your knee is supported between repetitions. Repeat __________  times. Complete this stretch __________ times a day. Exercise B: Knee Flexion, Active  1. Lie on your back with both knees straight. If this causes back discomfort, bend your left / right knee so your foot is flat on the floor. 2. Slowly slide your left / right heel back toward your buttocks until you feel a gentle stretch in the front of your knee or thigh. 3. Hold this position for __________ seconds. 4. Slowly slide your left / right heel back to the starting position. Repeat __________ times. Complete this exercise __________ times a day. Exercise C: Quadriceps, Prone  1. Lie on your abdomen on a firm surface, such as a bed or padded floor. 2. Bend your left / right knee and hold your ankle. If you cannot reach your ankle or pant leg, loop a belt around your foot and grab the belt instead. 3. Gently pull your heel toward your buttocks. Your knee should not slide out to the side. You should feel a stretch in the front of your thigh and knee. 4. Hold this position for __________ seconds. Repeat __________ times. Complete this stretch __________ times a day. Exercise D: Hamstring, Supine 1. Lie on your back. 2. Loop a belt or towel over the ball of your left / right foot. The ball of your foot is on the walking surface, right under your toes. 3. Straighten your left / right knee and slowly pull on the belt to raise your leg until you feel a gentle stretch behind your knee. ? Do not let your left / right knee bend while you do this. ? Keep your other leg flat on the floor. 4. Hold this position for __________ seconds. Repeat __________ times. Complete this stretch __________ times a day. STRENGTHENING EXERCISES These exercises build strength and endurance in your knee. Endurance is the ability to use your muscles for a long time, even after they get tired. Exercise E: Quadriceps, Isometric  1. Lie on your back with your left / right leg extended and your other knee bent. Put a rolled towel  or small pillow under your knee if   told by your health care provider. 2. Slowly tense the muscles in the front of your left / right thigh. You should see your kneecap slide up toward your hip or see increased dimpling just above the knee. This motion will push the back of the knee toward the floor. 3. For __________ seconds, keep the muscle as tight as you can without increasing your pain. 4. Relax the muscles slowly and completely. Repeat __________ times. Complete this exercise __________ times a day. Exercise F: Straight Leg Raises - Quadriceps 1. Lie on your back with your left / right leg extended and your other knee bent. 2. Tense the muscles in the front of your left / right thigh. You should see your kneecap slide up or see increased dimpling just above the knee. Your thigh may even shake a bit. 3. Keep these muscles tight as you raise your leg 4-6 inches (10-15 cm) off the floor. Do not let your knee bend. 4. Hold this position for __________ seconds. 5. Keep these muscles tense as you lower your leg. 6. Relax your muscles slowly and completely after each repetition. Repeat __________ times. Complete this exercise __________ times a day. Exercise G: Hamstring, Isometric 1. Lie on your back on a firm surface. 2. Bend your left / right knee approximately __________ degrees. 3. Dig your left / right heel into the surface as if you are trying to pull it toward your buttocks. Tighten the muscles in the back of your thighs to dig as hard as you can without increasing any pain. 4. Hold this position for __________ seconds. 5. Release the tension gradually and allow your muscles to relax completely for __________ seconds after each repetition. Repeat __________ times. Complete this exercise __________ times a day. Exercise H: Hamstring Curls  If told by your health care provider, do this exercise while wearing ankle weights. Begin with __________ weights. Then increase the weight by 1 lb (0.5  kg) increments. Do not wear ankle weights that are more than __________. 1. Lie on your abdomen with your legs straight. 2. Bend your left / right knee as far as you can without feeling pain. Keep your hips flat against the floor. 3. Hold this position for __________ seconds. 4. Slowly lower your leg to the starting position.  Repeat __________ times. Complete this exercise __________ times a day. Exercise I: Squats (Quadriceps) 1. Stand in front of a table, with your feet and knees pointing straight ahead. You may rest your hands on the table for balance but not for support. 2. Slowly bend your knees and lower your hips like you are going to sit in a chair. ? Keep your weight over your heels, not over your toes. ? Keep your lower legs upright so they are parallel with the table legs. ? Do not let your hips go lower than your knees. ? Do not bend lower than told by your health care provider. ? If your knee pain increases, do not bend as low. 3. Hold the squat position for __________ seconds. 4. Slowly push with your legs to return to standing. Do not use your hands to pull yourself to standing. Repeat __________ times. Complete this exercise __________ times a day. Exercise J: Wall Slides (Quadriceps)  1. Lean your back against a smooth wall or door while you walk your feet out 18-24 inches (46-61 cm) from it. 2. Place your feet hip-width apart. 3. Slowly slide down the wall or door until your knees bend __________ degrees. Keep   your knees over your heels, not over your toes. Keep your knees in line with your hips. 4. Hold for __________ seconds. Repeat __________ times. Complete this exercise __________ times a day. Exercise K: Straight Leg Raises - Hip Abductors 1. Lie on your side with your left / right leg in the top position. Lie so your head, shoulder, knee, and hip line up. You may bend your bottom knee to help you keep your balance. 2. Roll your hips slightly forward so your hips  are stacked directly over each other and your left / right knee is facing forward. 3. Leading with your heel, lift your top leg 4-6 inches (10-15 cm). You should feel the muscles in your outer hip lifting. ? Do not let your foot drift forward. ? Do not let your knee roll toward the ceiling. 4. Hold this position for __________ seconds. 5. Slowly return your leg to the starting position. 6. Let your muscles relax completely after each repetition. Repeat __________ times. Complete this exercise __________ times a day. Exercise L: Straight Leg Raises - Hip Extensors 1. Lie on your abdomen on a firm surface. You can put a pillow under your hips if that is more comfortable. 2. Tense the muscles in your buttocks and lift your left / right leg about 4-6 inches (10-15 cm). Keep your knee straight as you lift your leg. 3. Hold this position for __________ seconds. 4. Slowly lower your leg to the starting position. 5. Let your leg relax completely after each repetition. Repeat __________ times. Complete this exercise __________ times a day. This information is not intended to replace advice given to you by your health care provider. Make sure you discuss any questions you have with your health care provider. Document Released: 06/06/2005 Document Revised: 04/16/2016 Document Reviewed: 05/29/2015 [employer] Interactive Patient Education  2018 [employer]  

## 2017-12-23 ENCOUNTER — Other Ambulatory Visit: Payer: Self-pay | Admitting: *Deleted

## 2017-12-23 MED ORDER — APREMILAST 30 MG PO TABS: 1.0000 | ORAL_TABLET | Freq: Two times a day (BID) | ORAL | 0 refills | Status: DC

## 2017-12-23 NOTE — Telephone Encounter (Signed)
Refill request received via fax  Last visit: 10/07/17 Next visit: 04/14/18  Okay to refill per Dr. Corliss Skains

## 2018-03-12 ENCOUNTER — Other Ambulatory Visit: Payer: Self-pay | Admitting: Rheumatology

## 2018-03-13 NOTE — Telephone Encounter (Signed)
Last visit: 10/07/17 Next visit: 04/14/18  Okay to refill per Dr. Corliss Skainseveshwar

## 2018-03-31 NOTE — Progress Notes (Signed)
Office Visit Note  Patient: Madeline Ayala             Date of Birth: Sep 17, 1965           MRN: [id number]             PCP: [facility] Referring: [facility]* Visit Date: 04/14/2018 Occupation: @GUAROCC @  Subjective:  Right hand pain   History of Present Illness: Madeline Ayala is a 52 y.o. female with history of psoriatic arthritis and osteoarthritis.  Patient is on Otezla 30 mg po BID. she reports that she has been having a flare in her right hand with past 1 month.  She states that she was on vacation recently and missed 3 doses of her [place] which she feels contributed.  She denies any wrist pain. She says she is also been having increased discomfort in bilateral feet but denies any joint swelling.  She reports mild elbow joint pain. She reports she continues to have bilateral plantar fasciitis and bilateral SI joint pain.  She denies any Achilles tendinitis at this time.  She denies any psoriasis.   Activities of Daily Living:  Patient reports morning stiffness for 3-4  hours.   Patient Reports nocturnal pain.  Difficulty dressing/grooming: Denies Difficulty climbing stairs: Reports Difficulty getting out of chair: Reports Difficulty using hands for taps, buttons, cutlery, and/or writing: Reports  Review of Systems  Constitutional: Positive for fatigue.  HENT: Positive for mouth dryness. Negative for mouth sores and nose dryness.   Eyes: Positive for dryness. Negative for pain and visual disturbance.  Respiratory: Negative for cough, hemoptysis, shortness of breath and difficulty breathing.   Cardiovascular: Negative for chest pain, palpitations, hypertension and swelling in legs/feet.  Gastrointestinal: Positive for constipation and diarrhea. Negative for blood in stool.  Endocrine: Negative for increased urination.  Genitourinary: Negative for painful urination.  Musculoskeletal: Positive for arthralgias, joint pain, joint  swelling and morning stiffness. Negative for myalgias, muscle weakness, muscle tenderness and myalgias.  Skin: Negative for color change, pallor, rash, hair loss, nodules/bumps, skin tightness, ulcers and sensitivity to sunlight.  Allergic/Immunologic: Negative for susceptible to infections.  Neurological: Negative for dizziness, numbness, headaches and weakness.  Hematological: Negative for swollen glands.  Psychiatric/Behavioral: Positive for sleep disturbance. Negative for depressed mood. The patient is not nervous/anxious.     PMFS History:  Patient Active Problem List   Diagnosis Date Noted  . Psoriatic arthritis (HCC) 10/29/2016  . Psoriasis 10/29/2016  . High risk medication use 10/29/2016  . Needle phobia 10/29/2016  . DDD (degenerative disc disease), lumbar 10/29/2016  . DDD (degenerative disc disease), cervical 10/29/2016  . History of scoliosis 10/29/2016  . History of asthma 10/29/2016  . History of duodenal ulcer 10/29/2016    Past Medical History:  Diagnosis Date  . Arthritis   . Asthma     Family History  Problem Relation Age of Onset  . Diabetes Mother   . COPD Mother   . Spina bifida Mother   . Thyroid disease Mother   . Scoliosis Mother   . Heart disease Mother   . Macular degeneration Father   . Skin cancer Father        on ear  . Depression Brother   . Bipolar disorder Brother   . Depression Daughter   . Post-traumatic stress disorder Daughter    Past Surgical History:  Procedure Laterality Date  . BACK SURGERY    . KNEE SURGERY    .  TONSILLECTOMY     Social History   Social History Narrative  . Not on file    Objective: Vital Signs: BP 104/67 (BP Location: Left Arm, Patient Position: Sitting, Cuff Size: Normal)   Pulse 64   Resp 14   Ht 5\' 7"  (1.702 m)   Wt 168 lb 12.8 oz (76.6 kg)   LMP 04/10/2015   BMI 26.44 kg/m    Physical Exam  Constitutional: She is oriented to person, place, and time. She appears well-developed and  well-nourished.  HENT:  Head: Normocephalic and atraumatic.  Eyes: Conjunctivae and EOM are normal.  Neck: Normal range of motion.  Cardiovascular: Normal rate, regular rhythm, normal heart sounds and intact distal pulses.  Pulmonary/Chest: Effort normal and breath sounds normal.  Abdominal: Soft. Bowel sounds are normal.  Lymphadenopathy:    She has no cervical adenopathy.  Neurological: She is alert and oriented to person, place, and time.  Skin: Skin is warm and dry. Capillary refill takes less than 2 seconds.  No patches of psoriasis at this time.  Psychiatric: She has a normal mood and affect. Her behavior is normal.  Nursing note and vitals reviewed.    Musculoskeletal Exam: C-spine good range of motion with no discomfort.  Thoracic and lumbar spine limited range of motion.  She has bilateral SI joint tenderness.  She has midline spinal tenderness in the lumbar region.  Shoulder joints, elbow joints, wrist joints, MCPs and PIPs, DIPs good range of motion.  She has synovitis of her right third MCP joint.  She has complete fist formation bilaterally.  Hip joints good range of motion with no discomfort.  Knee joints, ankle joints, MCPs, PIPs, DIPs good range of motion with no synovitis.  No warmth or effusion of bilateral knee joints.  No tenderness or swelling of ankle joints.  No Achilles tendinitis.  She has tenderness of bilateral plantar fascia.  She has mild tenderness of MTP joints on exam.  CDAI Exam: CDAI Score: 3.1  Patient Global Assessment: 5 (mm); Provider Global Assessment: 6 (mm) Swollen: 1 ; Tender: 3  Joint Exam      Right  Left  MCP 3  Swollen Tender     Sacroiliac   Tender   Tender     Investigation: No additional findings.  Imaging: No results found.  Recent Labs: Lab Results  Component Value Date   WBC 11.6 (H) 10/23/2016   HGB 13.0 10/23/2016   PLT 271 10/23/2016   NA 138 10/23/2016   K 4.6 10/23/2016   CL 103 10/23/2016   CO2 29 10/23/2016    GLUCOSE 80 10/23/2016   BUN 13 10/23/2016   CREATININE 0.94 10/23/2016   BILITOT 0.4 10/23/2016   ALKPHOS 70 10/23/2016   AST 19 10/23/2016   ALT 17 10/23/2016   PROT 7.6 10/23/2016   ALBUMIN 4.4 10/23/2016   CALCIUM 9.8 10/23/2016   GFRAA >60 10/23/2016    Speciality Comments: No specialty comments available.  Procedures:  No procedures performed Allergies: Azithromycin; Erythromycin; Other; Penicillins; Propoxyphene; Sulfa antibiotics; Tetracyclines & related; Tolectin [tolmetin]; Vancomycin; Codeine; Hydrocodone; Oxycodone; and Percocet [oxycodone-acetaminophen]       Assessment / Plan:     Visit Diagnoses: Psoriatic arthritis (HCC) - She has synovitis of right 3rd MCP joint.  She has been flaring for the past 1 month.  She has bilateral plantar fasciitis as well as bilateral SI joint tenderness.  She has no Achilles tendinitis on exam.  She has been having increased pain in  both feet for the past 1 month.  She missed 3 doses of Otezla 30 mg by mouth twice daily while on vacation recently.  She reports that she previously was taking methotrexate but did not feel it is effective and discontinued in March 2018.  She has not tried any other medications for the treatment of psoriatic arthritis but reports that she has a severe needle phobia and knows that she would not be compliant if she was started on an injectable medication.   We discussed the indications, contraindications, and potential side effects of Stelara.  She would not like to switch medications at this time.  She was advised to notify us if she changes her mind.  She was given a handout of information about Stelara today in the office.  She will continue taking Otezla 30 mg po BID.  A prescription for prednisone will be sent to the pharmacy since she is flaring.  She was advised to notify us is she continues to have increased joint pain and joint swelling.  She will follow up in the office in 5 months.  Counseled patient that  Marcy Panning is a IL-12 & 23 inhibitor.  Counseled patient on purpose, proper use, and adverse effects of Stelara.  Reviewed the most common adverse effects including infection and injection site reactions. Discussed that there is the possibility of an increased risk of malignancy and rare cases of RPLS.  Reviewed the signs and symptoms of RPLS with patient.  Reviewed the importance of regular labs while on Stelara therapy.  Counseled patient that Stelara should be held prior to scheduled surgery.  Advised patient to get annual influenza vaccine and the pneumococcal vaccine as indicated.  Counseled patient to avoid live vaccines while on Stelara.  Provided patient with medication education material and answered all questions.     Hep panel: 03/01/2015 SPEP: 03/01/2015  Psoriasis: She has no active psoriasis at this time.   High risk medication use - Otezla 30 mg po bid.( methotrexate was discontinued in March 2018 by pt-did not feel it was effective)    Primary osteoarthritis of both hands: She has PIP and DIP synovial thickening.  She has complete fist formation bilaterally.  Joint protection and muscle strengthening were discussed.   DDD (degenerative disc disease), cervical: S/p fusion x2. Chronic pain. She has good ROM.    DDD (degenerative disc disease), lumbar: Chronic pain.  She has midline spinal tenderness in the lumbar region.   Needle phobia: She is not ready to switch from [place] to an injectable medication.   Other medical conditions are listed as follows:   History of scoliosis  History of asthma  History of duodenal ulcer  Smoker   Orders: No orders of the defined types were placed in this encounter.  Meds ordered this encounter  Medications  . predniSONE (DELTASONE) 5 MG tablet    Sig: Take 4 tablets by mouth daily x2 days, 3 tablets by mouth daily x2 days, 2 tablets by mouth daily x2 days, 1 tablet by mouth daily x2 days    Dispense:  20 tablet    Refill:  0     Face-to-face time spent with patient was 30 minutes. Greater than 50% of time was spent in counseling and coordination of care.  Follow-Up Instructions: Return in about 5 months (around 09/14/2018) for Psoriatic arthritis, Osteoarthritis, DDD.   Gearldine Bienenstock, PA-C   I examined and evaluated the patient with Sherron Ales PA.  Patient continues to have some synovitis  in her hands and discomfort in her feet.  We had detailed discussion regarding different treatment options.  She does not want to take methotrexate.  She has fear of needles.  Initially she agreed to start on Stelara.  After she reviewed the medication and thought about the whole process of having injections she is declined to [place].  The plan of care was discussed as noted above.  Pollyann Savoy, MD  Note - This record has been created using Animal nutritionist.  Chart creation errors have been sought, but may not always  have been located. Such creation errors do not reflect on  the standard of medical care.

## 2018-04-14 ENCOUNTER — Ambulatory Visit: Payer: BLUE CROSS/BLUE SHIELD | Admitting: Rheumatology

## 2018-04-14 ENCOUNTER — Encounter: Payer: Self-pay | Admitting: Rheumatology

## 2018-04-14 VITALS — BP 104/67 | HR 64 | Resp 14 | Ht 67.0 in | Wt 168.8 lb

## 2018-04-14 DIAGNOSIS — L4050 Arthropathic psoriasis, unspecified: Secondary | ICD-10-CM | POA: Diagnosis not present

## 2018-04-14 DIAGNOSIS — M19041 Primary osteoarthritis, right hand: Secondary | ICD-10-CM

## 2018-04-14 DIAGNOSIS — M19042 Primary osteoarthritis, left hand: Secondary | ICD-10-CM

## 2018-04-14 DIAGNOSIS — M5136 Other intervertebral disc degeneration, lumbar region: Secondary | ICD-10-CM

## 2018-04-14 DIAGNOSIS — F17200 Nicotine dependence, unspecified, uncomplicated: Secondary | ICD-10-CM

## 2018-04-14 DIAGNOSIS — M5030 Other cervical disc degeneration, unspecified cervical region: Secondary | ICD-10-CM

## 2018-04-14 DIAGNOSIS — F40298 Other specified phobia: Secondary | ICD-10-CM

## 2018-04-14 DIAGNOSIS — L409 Psoriasis, unspecified: Secondary | ICD-10-CM | POA: Diagnosis not present

## 2018-04-14 DIAGNOSIS — Z8709 Personal history of other diseases of the respiratory system: Secondary | ICD-10-CM

## 2018-04-14 DIAGNOSIS — Z79899 Other long term (current) drug therapy: Secondary | ICD-10-CM | POA: Diagnosis not present

## 2018-04-14 DIAGNOSIS — Z8719 Personal history of other diseases of the digestive system: Secondary | ICD-10-CM

## 2018-04-14 DIAGNOSIS — Z8739 Personal history of other diseases of the musculoskeletal system and connective tissue: Secondary | ICD-10-CM

## 2018-04-14 MED ORDER — PREDNISONE 5 MG PO TABS: ORAL_TABLET | ORAL | 0 refills | Status: DC

## 2018-04-14 NOTE — Patient Instructions (Addendum)
Magnesium malate 250 mg po at bedtime  If you tolerate 250 mg dose, you can increase to 500 mg at bedtime.    Please talk to your primary care doctor about receiving the flu vaccine, shingrix vaccine & pneumococcal vaccine.  You can not receive any live vaccines.   Ustekinumab injection What is this medicine? USTEKINUMAB ([place] te KIN ue mab) is used to treat plaque psoriasis and psoriatic arthritis. This medicine is also used to treat Crohn's disease. It is not a cure. This medicine may be used for other purposes; ask your health care provider or pharmacist if you have questions. COMMON BRAND NAME(S): Stelara What should I tell my health care provider before I take this medicine? They need to know if you have any of these conditions: -cancer -diabetes -immune system problems -infection (especially a virus infection such as chickenpox, cold sores, or herpes) or history of infections -receiving or have received allergy shots -recently received or scheduled to receive a vaccine -tuberculosis, a positive skin test for tuberculosis, or have recently been in close contact with someone who has tuberculosis -an unusual reaction to ustekinumab, latex, other medicines, foods, dyes, or preservatives -pregnant or trying to get pregnant -breast-feeding How should I use this medicine? This medicine is for injection under the skin or infusion into a vein. It is usually given by a health care professional in a hospital or clinic setting. If you get this medicine at home, you will be taught how to prepare and give this medicine. Use exactly as directed. Take your medicine at regular intervals. Do not take your medicine more often than directed. It is important that you put your used needles and syringes in a special sharps container. Do not put them in a trash can. If you do not have a sharps container, call your pharmacist or healthcare provider to get one. A special MedGuide will be given to you by the  pharmacist with each prescription and refill. Be sure to read this information carefully each time. Talk to your pediatrician regarding the use of this medicine in children. While this drug may be prescribed for children as young as 12 years for selected conditions, precautions do apply. Overdosage: If you think you have taken too much of this medicine contact a poison control center or emergency room at once. NOTE: This medicine is only for you. Do not share this medicine with others. What if I miss a dose? If you miss a dose, take it as soon as you can. If it is almost time for your next dose, take only that dose. Do not take double or extra doses. What may interact with this medicine? Do not take this medicine with any of the following medications: -live virus vaccines This medicine may also interact with the following medications: -cyclosporine -immunosuppressives -vaccines -warfarin This list may not describe all possible interactions. Give your health care provider a list of all the medicines, herbs, non-prescription drugs, or dietary supplements you use. Also tell them if you smoke, drink alcohol, or use illegal drugs. Some items may interact with your medicine. What should I watch for while using this medicine? Your condition will be monitored carefully while you are receiving this medicine. Tell your doctor or healthcare professional if your symptoms do not start to get better or if they get worse. You will be tested for tuberculosis (TB) before you start this medicine. If your doctor prescribes any medicine for TB, you should start taking the TB medicine before starting this  medicine. Make sure to finish the full course of TB medicine. Call your doctor or health care professional if you get a cold or other infection while receiving this medicine. Do not treat yourself. This medicine may decrease your body's ability to fight infection. Talk to your doctor about your risk of cancer. You  may be more at risk for certain types of cancers if you take this medicine. What side effects may I notice from receiving this medicine? Side effects that you should report to your doctor or health care professional as soon as possible: -allergic reactions like skin rash, itching or hives, swelling of the face, lips, or tongue -breathing problems -changes in vision -confusion -seizures -signs and symptoms of infection like fever or chills; cough; sore throat; pain or trouble passing urine -swollen lymph nodes in the neck, underarm, or groin areas -unexplained weight loss -unusually weak or tired -vomiting Side effects that usually do not require medical attention (report to your doctor or health care professional if they continue or are bothersome): -headache -nausea -redness, itching, swelling, or bruising at site where injected This list may not describe all possible side effects. Call your doctor for medical advice about side effects. You may report side effects to FDA at 1-800-FDA-1088. Where should I keep my medicine? Keep out of the reach of children. If you are using this medicine at home, you will be instructed on how to store this medicine. Store the prefilled syringes in a refrigerator between 2 to 8 degrees C (36 to 46 degrees F). Keep in the original carton. Protect from light. Do not freeze. Do not shake. Throw away any unused medicine after the expiration date on the label. NOTE: This sheet is a summary. It may not cover all possible information. If you have questions about this medicine, talk to your doctor, pharmacist, or health care provider.  2018 Elsevier/Gold Standard (2016-05-22 09:12:47)

## 2018-06-20 ENCOUNTER — Telehealth: Payer: Self-pay | Admitting: *Deleted

## 2018-06-20 NOTE — Telephone Encounter (Signed)
Called patient's phone 3 times to follow up. Keep getting a busy signal.

## 2018-06-20 NOTE — Telephone Encounter (Signed)
[facility] contacted office stating they have tried to reach patient in regards to next delivery of Otezla without success. Contact number [phone].

## 2018-07-09 ENCOUNTER — Other Ambulatory Visit: Payer: Self-pay | Admitting: Rheumatology

## 2018-07-09 NOTE — Telephone Encounter (Signed)
Last visit: 04/14/18 Next Visit: 09/15/18  Okay to refill per Dr. Corliss Skainseveshwar

## 2018-08-18 ENCOUNTER — Telehealth: Payer: Self-pay | Admitting: *Deleted

## 2018-08-18 NOTE — Telephone Encounter (Signed)
Patient states she has not received her [place]. Called requesting refill. Patient advised that we sent in a 90 day supply on 07/09/18. Patient will contact the pharmacy and call back if she has any problems.

## 2018-09-01 NOTE — Progress Notes (Deleted)
Office Visit Note  Patient: Madeline Ayala             Date of Birth: 1965-11-13           MRN: [id number]             PCP: [facility] Referring: [facility]* Visit Date: 09/15/2018 Occupation: @GUAROCC @  Subjective:  No chief complaint on file.   History of Present Illness: Madeline Ayala is a 53 y.o. female ***   Activities of Daily Living:  Patient reports morning stiffness for *** {minute/hour:19697}.   Patient {ACTIONS;DENIES/REPORTS:21021675::"Denies"} nocturnal pain.  Difficulty dressing/grooming: {ACTIONS;DENIES/REPORTS:21021675::"Denies"} Difficulty climbing stairs: {ACTIONS;DENIES/REPORTS:21021675::"Denies"} Difficulty getting out of chair: {ACTIONS;DENIES/REPORTS:21021675::"Denies"} Difficulty using hands for taps, buttons, cutlery, and/or writing: {ACTIONS;DENIES/REPORTS:21021675::"Denies"}  No Rheumatology ROS completed.   PMFS History:  Patient Active Problem List   Diagnosis Date Noted  . Psoriatic arthritis (HCC) 10/29/2016  . Psoriasis 10/29/2016  . High risk medication use 10/29/2016  . Needle phobia 10/29/2016  . DDD (degenerative disc disease), lumbar 10/29/2016  . DDD (degenerative disc disease), cervical 10/29/2016  . History of scoliosis 10/29/2016  . History of asthma 10/29/2016  . History of duodenal ulcer 10/29/2016    Past Medical History:  Diagnosis Date  . Arthritis   . Asthma     Family History  Problem Relation Age of Onset  . Diabetes Mother   . COPD Mother   . Spina bifida Mother   . Thyroid disease Mother   . Scoliosis Mother   . Heart disease Mother   . Macular degeneration Father   . Skin cancer Father        on ear  . Depression Brother   . Bipolar disorder Brother   . Depression Daughter   . Post-traumatic stress disorder Daughter    Past Surgical History:  Procedure Laterality Date  . BACK SURGERY    . KNEE SURGERY    . TONSILLECTOMY     Social History   Social  History Narrative  . Not on file    There is no immunization history on file for this patient.   Objective: Vital Signs: LMP 04/10/2015    Physical Exam   Musculoskeletal Exam: ***  CDAI Exam: CDAI Score: Not documented Patient Global Assessment: Not documented; Provider Global Assessment: Not documented Swollen: Not documented; Tender: Not documented Joint Exam   Not documented   There is currently no information documented on the homunculus. Go to the Rheumatology activity and complete the homunculus joint exam.  Investigation: No additional findings.  Imaging: No results found.  Recent Labs: Lab Results  Component Value Date   WBC 11.6 (H) 10/23/2016   HGB 13.0 10/23/2016   PLT 271 10/23/2016   NA 138 10/23/2016   K 4.6 10/23/2016   CL 103 10/23/2016   CO2 29 10/23/2016   GLUCOSE 80 10/23/2016   BUN 13 10/23/2016   CREATININE 0.94 10/23/2016   BILITOT 0.4 10/23/2016   ALKPHOS 70 10/23/2016   AST 19 10/23/2016   ALT 17 10/23/2016   PROT 7.6 10/23/2016   ALBUMIN 4.4 10/23/2016   CALCIUM 9.8 10/23/2016   GFRAA >60 10/23/2016    Speciality Comments: No specialty comments available.  Procedures:  No procedures performed Allergies: Azithromycin; Erythromycin; Other; Penicillins; Propoxyphene; Sulfa antibiotics; Tetracyclines & related; Tolectin [tolmetin]; Vancomycin; Codeine; Hydrocodone; Oxycodone; and Percocet [oxycodone-acetaminophen]   Assessment / Plan:     Visit Diagnoses: Psoriatic arthritis (HCC)  Psoriasis  High risk medication use -  Otezla 30 mg BID, discussed Stelara at last visit inadequate reponse to MTX in the past  Primary osteoarthritis of both hands  DDD (degenerative disc disease), cervical  DDD (degenerative disc disease), lumbar  Needle phobia  History of scoliosis  History of asthma  History of duodenal ulcer  Smoker   Orders: No orders of the defined types were placed in this encounter.  No orders of the defined  types were placed in this encounter.   Face-to-face time spent with patient was *** minutes. Greater than 50% of time was spent in counseling and coordination of care.  Follow-Up Instructions: No follow-ups on file.   Gearldine Bienenstock, PA-C  Note - This record has been created using Dragon software.  Chart creation errors have been sought, but may not always  have been located. Such creation errors do not reflect on  the standard of medical care.

## 2018-09-11 NOTE — Progress Notes (Deleted)
Office Visit Note  Patient: Madeline Ayala             Date of Birth: 06/08/1966           MRN: [id number]             PCP: The [facility] Referring: The [facility]* Visit Date: 09/25/2018 Occupation: @GUAROCC @  Subjective:  No chief complaint on file.   History of Present Illness: Madeline Ayala is a 53 y.o. female ***   Activities of Daily Living:  Patient reports morning stiffness for *** {minute/hour:19697}.   Patient {ACTIONS;DENIES/REPORTS:21021675::"Denies"} nocturnal pain.  Difficulty dressing/grooming: {ACTIONS;DENIES/REPORTS:21021675::"Denies"} Difficulty climbing stairs: {ACTIONS;DENIES/REPORTS:21021675::"Denies"} Difficulty getting out of chair: {ACTIONS;DENIES/REPORTS:21021675::"Denies"} Difficulty using hands for taps, buttons, cutlery, and/or writing: {ACTIONS;DENIES/REPORTS:21021675::"Denies"}  No Rheumatology ROS completed.   PMFS History:  Patient Active Problem List   Diagnosis Date Noted  . Psoriatic arthritis (HCC) 10/29/2016  . Psoriasis 10/29/2016  . High risk medication use 10/29/2016  . Needle phobia 10/29/2016  . DDD (degenerative disc disease), lumbar 10/29/2016  . DDD (degenerative disc disease), cervical 10/29/2016  . History of scoliosis 10/29/2016  . History of asthma 10/29/2016  . History of duodenal ulcer 10/29/2016    Past Medical History:  Diagnosis Date  . Arthritis   . Asthma     Family History  Problem Relation Age of Onset  . Diabetes Mother   . COPD Mother   . Spina bifida Mother   . Thyroid disease Mother   . Scoliosis Mother   . Heart disease Mother   . Macular degeneration Father   . Skin cancer Father        on ear  . Depression Brother   . Bipolar disorder Brother   . Depression Daughter   . Post-traumatic stress disorder Daughter    Past Surgical History:  Procedure Laterality Date  . BACK SURGERY    . KNEE SURGERY    . TONSILLECTOMY     Social History   Social  History Narrative  . Not on file    There is no immunization history on file for this patient.   Objective: Vital Signs: LMP 04/10/2015    Physical Exam   Musculoskeletal Exam: ***  CDAI Exam: CDAI Score: Not documented Patient Global Assessment: Not documented; Provider Global Assessment: Not documented Swollen: Not documented; Tender: Not documented Joint Exam   Not documented   There is currently no information documented on the homunculus. Go to the Rheumatology activity and complete the homunculus joint exam.  Investigation: No additional findings.  Imaging: No results found.  Recent Labs: Lab Results  Component Value Date   WBC 11.6 (H) 10/23/2016   HGB 13.0 10/23/2016   PLT 271 10/23/2016   NA 138 10/23/2016   K 4.6 10/23/2016   CL 103 10/23/2016   CO2 29 10/23/2016   GLUCOSE 80 10/23/2016   BUN 13 10/23/2016   CREATININE 0.94 10/23/2016   BILITOT 0.4 10/23/2016   ALKPHOS 70 10/23/2016   AST 19 10/23/2016   ALT 17 10/23/2016   PROT 7.6 10/23/2016   ALBUMIN 4.4 10/23/2016   CALCIUM 9.8 10/23/2016   GFRAA >60 10/23/2016    Speciality Comments: No specialty comments available.  Procedures:  No procedures performed Allergies: Azithromycin; Erythromycin; Other; Penicillins; Propoxyphene; Sulfa antibiotics; Tetracyclines & related; Tolectin [tolmetin]; Vancomycin; Codeine; Hydrocodone; Oxycodone; and Percocet [oxycodone-acetaminophen]   Assessment / Plan:     Visit Diagnoses: No diagnosis found.   Orders: No orders of the defined  types were placed in this encounter.  No orders of the defined types were placed in this encounter.   Face-to-face time spent with patient was *** minutes. Greater than 50% of time was spent in counseling and coordination of care.  Follow-Up Instructions: No follow-ups on file.   Ellen Henri, CMA  Note - This record has been created using Animal nutritionist.  Chart creation errors have been sought, but may not  always  have been located. Such creation errors do not reflect on  the standard of medical care.

## 2018-09-15 ENCOUNTER — Ambulatory Visit: Payer: BLUE CROSS/BLUE SHIELD | Admitting: Physician Assistant

## 2018-09-21 ENCOUNTER — Encounter: Payer: Self-pay | Admitting: Rheumatology

## 2018-09-25 ENCOUNTER — Ambulatory Visit: Payer: BLUE CROSS/BLUE SHIELD | Admitting: Physician Assistant

## 2018-09-26 NOTE — Progress Notes (Deleted)
Office Visit Note  Patient: Madeline Ayala             Date of Birth: [birth year]           MRN: [id number]             PCP: [facility] Referring: [facility]* Visit Date: 10/01/2018 Occupation: @GUAROCC @  Subjective:  No chief complaint on file.   History of Present Illness: Shelleen Cahan is a 53 y.o. female ***   Activities of Daily Living:  Patient reports morning stiffness for *** {minute/hour:19697}.   Patient {ACTIONS;DENIES/REPORTS:21021675::"Denies"} nocturnal pain.  Difficulty dressing/grooming: {ACTIONS;DENIES/REPORTS:21021675::"Denies"} Difficulty climbing stairs: {ACTIONS;DENIES/REPORTS:21021675::"Denies"} Difficulty getting out of chair: {ACTIONS;DENIES/REPORTS:21021675::"Denies"} Difficulty using hands for taps, buttons, cutlery, and/or writing: {ACTIONS;DENIES/REPORTS:21021675::"Denies"}  No Rheumatology ROS completed.   PMFS History:  Patient Active Problem List   Diagnosis Date Noted  . Psoriatic arthritis (HCC) 10/29/2016  . Psoriasis 10/29/2016  . High risk medication use 10/29/2016  . Needle phobia 10/29/2016  . DDD (degenerative disc disease), lumbar 10/29/2016  . DDD (degenerative disc disease), cervical 10/29/2016  . History of scoliosis 10/29/2016  . History of asthma 10/29/2016  . History of duodenal ulcer 10/29/2016    Past Medical History:  Diagnosis Date  . Arthritis   . Asthma     Family History  Problem Relation Age of Onset  . Diabetes Mother   . COPD Mother   . Spina bifida Mother   . Thyroid disease Mother   . Scoliosis Mother   . Heart disease Mother   . Macular degeneration Father   . Skin cancer Father        on ear  . Depression Brother   . Bipolar disorder Brother   . Depression Daughter   . Post-traumatic stress disorder Daughter    Past Surgical History:  Procedure Laterality Date  . BACK SURGERY    . KNEE SURGERY    . TONSILLECTOMY     Social History   Social  History Narrative  . Not on file    There is no immunization history on file for this patient.   Objective: Vital Signs: LMP 04/10/2015    Physical Exam   Musculoskeletal Exam: ***  CDAI Exam: CDAI Score: Not documented Patient Global Assessment: Not documented; Provider Global Assessment: Not documented Swollen: Not documented; Tender: Not documented Joint Exam   Not documented   There is currently no information documented on the homunculus. Go to the Rheumatology activity and complete the homunculus joint exam.  Investigation: No additional findings.  Imaging: No results found.  Recent Labs: Lab Results  Component Value Date   WBC 11.6 (H) 10/23/2016   HGB 13.0 10/23/2016   PLT 271 10/23/2016   NA 138 10/23/2016   K 4.6 10/23/2016   CL 103 10/23/2016   CO2 29 10/23/2016   GLUCOSE 80 10/23/2016   BUN 13 10/23/2016   CREATININE 0.94 10/23/2016   BILITOT 0.4 10/23/2016   ALKPHOS 70 10/23/2016   AST 19 10/23/2016   ALT 17 10/23/2016   PROT 7.6 10/23/2016   ALBUMIN 4.4 10/23/2016   CALCIUM 9.8 10/23/2016   GFRAA >60 10/23/2016    Speciality Comments: No specialty comments available.  Procedures:  No procedures performed Allergies: Azithromycin; Erythromycin; Other; Penicillins; Propoxyphene; Sulfa antibiotics; Tetracyclines & related; Tolectin [tolmetin]; Vancomycin; Codeine; Hydrocodone; Oxycodone; and Percocet [oxycodone-acetaminophen]   Assessment / Plan:     Visit Diagnoses: No diagnosis found.   Orders: No orders of the defined  types were placed in this encounter.  No orders of the defined types were placed in this encounter.   Face-to-face time spent with patient was *** minutes. Greater than 50% of time was spent in counseling and coordination of care.  Follow-Up Instructions: No follow-ups on file.   Ellen Henri, CMA  Note - This record has been created using Animal nutritionist.  Chart creation errors have been sought, but may not  always  have been located. Such creation errors do not reflect on  the standard of medical care.

## 2018-10-01 ENCOUNTER — Ambulatory Visit: Payer: BLUE CROSS/BLUE SHIELD | Admitting: Physician Assistant

## 2018-10-01 NOTE — Progress Notes (Signed)
Office Visit Note  Patient: Madeline Ayala             Date of Birth: [birth year]           MRN: [id number]             PCP: [facility] Referring: The [facility]* Visit Date: 10/02/2018 Occupation: @GUAROCC @  Subjective:  Pain in multiple joints   History of Present Illness: Madeline Ayala is a 53 y.o. female with history of psoriatic arthritis.  She takes Otezla 30 mg BID.  She does not feel like otezla is effective but she has a severe needle phobia and does not want an injectable medication.  She is having pain in both feet, both knee joints, and right shoulder pain. She has pain in both hands.  She denies any joint swelling.  She has psoriasis in both ear canals. She has bilateral plantar fasciitis.  She is having pain in both SI joints.    She reports her daughter moved out and is overwhelmed with household duties. She is unable to clean her home due to the discomfort she experiences and has hired someone to help clean her home.  She states her depression has been worsening.     Activities of Daily Living:  Patient reports morning stiffness for several hours.   Patient Reports nocturnal pain.  Difficulty dressing/grooming: Denies Difficulty climbing stairs: Reports Difficulty getting out of chair: Reports Difficulty using hands for taps, buttons, cutlery, and/or writing: Reports  Review of Systems  Constitutional: Positive for fatigue.  HENT: Negative for mouth sores, mouth dryness and nose dryness.   Eyes: Positive for dryness. Negative for pain, itching and visual disturbance.  Respiratory: Negative for cough, hemoptysis, shortness of breath, wheezing and difficulty breathing.   Cardiovascular: Negative for palpitations, hypertension and swelling in legs/feet.  Gastrointestinal: Negative for abdominal pain, blood in stool, constipation and diarrhea.  Endocrine: Negative for increased urination.  Genitourinary: Negative for painful  urination.  Musculoskeletal: Positive for arthralgias, joint pain and morning stiffness. Negative for joint swelling, myalgias, muscle weakness, muscle tenderness and myalgias.  Skin: Positive for rash. Negative for color change, pallor, hair loss, nodules/bumps, skin tightness, ulcers and sensitivity to sunlight.  Allergic/Immunologic: Negative for susceptible to infections.  Neurological: Negative for dizziness, light-headedness and numbness.  Hematological: Negative for swollen glands.  Psychiatric/Behavioral: Positive for depressed mood. Negative for sleep disturbance. The patient is nervous/anxious.     PMFS History:  Patient Active Problem List   Diagnosis Date Noted  . Psoriatic arthritis (HCC) 10/29/2016  . Psoriasis 10/29/2016  . High risk medication use 10/29/2016  . Needle phobia 10/29/2016  . DDD (degenerative disc disease), lumbar 10/29/2016  . DDD (degenerative disc disease), cervical 10/29/2016  . History of scoliosis 10/29/2016  . History of asthma 10/29/2016  . History of duodenal ulcer 10/29/2016    Past Medical History:  Diagnosis Date  . Arthritis   . Asthma     Family History  Problem Relation Age of Onset  . Diabetes Mother   . COPD Mother   . Spina bifida Mother   . Thyroid disease Mother   . Scoliosis Mother   . Heart disease Mother   . Macular degeneration Father   . Skin cancer Father        on ear  . Depression Brother   . Bipolar disorder Brother   . Depression Daughter   . Post-traumatic stress disorder Daughter    Past Surgical History:  Procedure Laterality Date  . BACK SURGERY    . KNEE SURGERY    . TONSILLECTOMY     Social History   Social History Narrative  . Not on file    There is no immunization history on file for this patient.   Objective: Vital Signs: BP 107/68 (BP Location: Left Arm, Patient Position: Sitting, Cuff Size: Normal)   Pulse 73   Resp 14   Ht 5\' 7"  (1.702 m)   Wt 165 lb 6.4 oz (75 kg)   LMP 04/10/2015    BMI 25.91 kg/m    Physical Exam Vitals signs and nursing note reviewed.  Constitutional:      Appearance: She is well-developed.  HENT:     Head: Normocephalic and atraumatic.  Eyes:     Conjunctiva/sclera: Conjunctivae normal.  Neck:     Musculoskeletal: Normal range of motion.  Cardiovascular:     Rate and Rhythm: Normal rate and regular rhythm.     Heart sounds: Normal heart sounds.  Pulmonary:     Effort: Pulmonary effort is normal.     Breath sounds: Normal breath sounds.  Abdominal:     General: Bowel sounds are normal.     Palpations: Abdomen is soft.  Lymphadenopathy:     Cervical: No cervical adenopathy.  Skin:    General: Skin is warm and dry.     Capillary Refill: Capillary refill takes less than 2 seconds.  Neurological:     Mental Status: She is alert and oriented to person, place, and time.  Psychiatric:        Behavior: Behavior normal.      Musculoskeletal Exam: C-spine good ROM. Limited ROM of lumbar spine with discomfort.Tenderness of both SI joints.  Right shoulder painful ROM. Shoulder joints, elbow joints, wrist joints, MCPs, PIPs, and DIPs good ROM with no synovitis.  PIP and DIP synovial thickening. Right 3rd MCP synovial thickening. Hip joints, knee joints, ankle joints, MTPs, PIPs, and DIPs good ROM with no synovitis.  No warmth or effusion of knee joints. Tenderness of bilateral 1st MTPs.  No synovitis noted in feet.    CDAI Exam: CDAI Score: Not documented Patient Global Assessment: Not documented; Provider Global Assessment: Not documented Swollen: Not documented; Tender: Not documented Joint Exam   Not documented   There is currently no information documented on the homunculus. Go to the Rheumatology activity and complete the homunculus joint exam.  Investigation: No additional findings.  Imaging: No results found.  Recent Labs: Lab Results  Component Value Date   WBC 11.6 (H) 10/23/2016   HGB 13.0 10/23/2016   PLT 271 10/23/2016    NA 138 10/23/2016   K 4.6 10/23/2016   CL 103 10/23/2016   CO2 29 10/23/2016   GLUCOSE 80 10/23/2016   BUN 13 10/23/2016   CREATININE 0.94 10/23/2016   BILITOT 0.4 10/23/2016   ALKPHOS 70 10/23/2016   AST 19 10/23/2016   ALT 17 10/23/2016   PROT 7.6 10/23/2016   ALBUMIN 4.4 10/23/2016   CALCIUM 9.8 10/23/2016   GFRAA >60 10/23/2016    Speciality Comments: No specialty comments available.  Procedures:  No procedures performed Allergies: Azithromycin; Erythromycin; Other; Penicillins; Propoxyphene; Sulfa antibiotics; Tetracyclines & related; Tolectin [tolmetin]; Vancomycin; Codeine; Hydrocodone; Oxycodone; and Percocet [oxycodone-acetaminophen]   Assessment / Plan:     Visit Diagnoses: Psoriatic arthritis (HCC): She has no synovitis or dactylitis on exam.  She has chronic pain in bilateral feet, bilateral knee joints, and the right shoulder joint.  X-rays of the feet, knee joints, and right shoulder obtained today.  Her psoriatic arthritis seems to be well controlled on Otezla 30 mg twice daily.  She has no active inflammation at this time.  She has tenderness of bilateral SI joints.  She has no Achilles tendinitis or plantar fasciitis.  She is psoriasis and bilateral ear canals.  She does not want to switch to injectable medication at this time.  She will continue on Otezla 30 mg twice daily.  In the past she has had significant pain relief when taking meloxicam.  We will check a CBC and CMP today and if labs are within normal limits we will send in meloxicam 15 mg by mouth daily PRN.  We will check lab work in 2 months and then every 6 months.  She is advised to notify us if she develops increased joint pain or joint swelling.  She will follow-up in the office in 5 months.  Psoriasis: She has psoriasis on bilateral ear canals.  High risk medication use - Otezla 30 mg po bid.( methotrexate was discontinued in March 2018 by pt-did not feel it was effective) CBC and CMP will be drawn  today.- Plan: CBC with Differential/Platelet, COMPLETE METABOLIC PANEL WITH GFR  Primary osteoarthritis of both hands: She has PIP and DIP synovial thickening consistent with osteoarthritis of bilateral hands.  She has no synovitis on exam.  She has complete fist formation bilaterally.  She experiences discomfort in both hands likely due to osteoarthritis.  Joint protection and muscle strengthening were discussed.  DDD (degenerative disc disease), cervical: She is good range of motion with no discomfort.  She has no symptoms of radiculopathy at this time.  DDD (degenerative disc disease), lumbar: Chronic pain.  She has limited range of motion with discomfort.  She has midline spinal tenderness and bilateral SI joint tenderness.  Needle phobia: She does not want to start on injectable medication.  Pain in both feet -She has chronic pain in bilateral feet.  She has tenderness of bilateral first MTP joints.  No synovitis was noted.  Discussed the importance of wearing proper fitting shoes.  X-rays of both feet were obtained today.  Plan: XR Foot 2 Views Right, XR Foot 2 Views Left  Chronic pain of both knees -She is been having increased pain in bilateral knee joints.  No warmth or effusion was noted.  X-rays of both knee joints were obtained today.  Plan: XR KNEE 3 VIEW LEFT, XR KNEE 3 VIEW RIGHT  Chronic right shoulder pain -She has been having increased pain in the right shoulder joint for the past several months.  She has not had any injuries.  She has good range of motion on exam but has discomfort.  No warmth or effusion was noted.  X-rays of the right shoulder were obtained today.  Plan: XR Shoulder Right   Other medical conditions are listed as follows:  History of scoliosis  History of asthma  History of duodenal ulcer  Smoker: She vapes.    Orders: Orders Placed This Encounter  Procedures  . XR KNEE 3 VIEW LEFT  . XR KNEE 3 VIEW RIGHT  . XR Foot 2 Views Right  . XR Foot 2  Views Left  . XR Shoulder Right  . CBC with Differential/Platelet  . COMPLETE METABOLIC PANEL WITH GFR   No orders of the defined types were placed in this encounter.   Face-to-face time spent with patient was30 minutes. Greater than 50% of time was  spent in counseling and coordination of care.  Follow-Up Instructions: Return in about 5 months (around 03/02/2019) for Psoriatic arthritis, Osteoarthritis, DDD.   Gearldine Bienenstock, PA-C  I examined and evaluated the patient with Sherron Ales PA.  Patient complains of pain in multiple joints and a lot of discomfort.  She had no synovitis on examination today.  Multiple x-rays were obtained which showed mild osteoarthritis.  He had detailed discussion regarding her disease situation.  I believe most of her discomfort is coming from underlying osteoarthritis.  She states she has done really well on the Mobic in the past.  We will obtain labs today.  Once the labs are available she will be given meloxicam 15 mg p.o. daily.  She was advised to come back for the labs in 2 months and then every 6 months to monitor her labs.  The plan of care was discussed as noted above.  Pollyann Savoy, MD Note - This record has been created using Animal nutritionist.  Chart creation errors have been sought, but may not always  have been located. Such creation errors do not reflect on  the standard of medical care.

## 2018-10-02 ENCOUNTER — Ambulatory Visit (INDEPENDENT_AMBULATORY_CARE_PROVIDER_SITE_OTHER): Payer: BLUE CROSS/BLUE SHIELD

## 2018-10-02 ENCOUNTER — Ambulatory Visit (INDEPENDENT_AMBULATORY_CARE_PROVIDER_SITE_OTHER): Payer: Self-pay

## 2018-10-02 ENCOUNTER — Encounter: Payer: Self-pay | Admitting: Physician Assistant

## 2018-10-02 ENCOUNTER — Ambulatory Visit: Payer: BLUE CROSS/BLUE SHIELD | Admitting: Physician Assistant

## 2018-10-02 VITALS — BP 107/68 | HR 73 | Resp 14 | Ht 67.0 in | Wt 165.4 lb

## 2018-10-02 DIAGNOSIS — M5136 Other intervertebral disc degeneration, lumbar region: Secondary | ICD-10-CM

## 2018-10-02 DIAGNOSIS — Z8719 Personal history of other diseases of the digestive system: Secondary | ICD-10-CM

## 2018-10-02 DIAGNOSIS — M79672 Pain in left foot: Secondary | ICD-10-CM

## 2018-10-02 DIAGNOSIS — L409 Psoriasis, unspecified: Secondary | ICD-10-CM

## 2018-10-02 DIAGNOSIS — M25511 Pain in right shoulder: Secondary | ICD-10-CM

## 2018-10-02 DIAGNOSIS — M25562 Pain in left knee: Secondary | ICD-10-CM

## 2018-10-02 DIAGNOSIS — M79671 Pain in right foot: Secondary | ICD-10-CM

## 2018-10-02 DIAGNOSIS — G8929 Other chronic pain: Secondary | ICD-10-CM

## 2018-10-02 DIAGNOSIS — M25561 Pain in right knee: Secondary | ICD-10-CM | POA: Diagnosis not present

## 2018-10-02 DIAGNOSIS — L4050 Arthropathic psoriasis, unspecified: Secondary | ICD-10-CM

## 2018-10-02 DIAGNOSIS — Z79899 Other long term (current) drug therapy: Secondary | ICD-10-CM | POA: Diagnosis not present

## 2018-10-02 DIAGNOSIS — M19041 Primary osteoarthritis, right hand: Secondary | ICD-10-CM | POA: Diagnosis not present

## 2018-10-02 DIAGNOSIS — M51369 Other intervertebral disc degeneration, lumbar region without mention of lumbar back pain or lower extremity pain: Secondary | ICD-10-CM

## 2018-10-02 DIAGNOSIS — Z8739 Personal history of other diseases of the musculoskeletal system and connective tissue: Secondary | ICD-10-CM

## 2018-10-02 DIAGNOSIS — Z8709 Personal history of other diseases of the respiratory system: Secondary | ICD-10-CM

## 2018-10-02 DIAGNOSIS — F17200 Nicotine dependence, unspecified, uncomplicated: Secondary | ICD-10-CM

## 2018-10-02 DIAGNOSIS — F40298 Other specified phobia: Secondary | ICD-10-CM

## 2018-10-02 DIAGNOSIS — M19042 Primary osteoarthritis, left hand: Secondary | ICD-10-CM

## 2018-10-02 DIAGNOSIS — M5030 Other cervical disc degeneration, unspecified cervical region: Secondary | ICD-10-CM

## 2018-10-03 ENCOUNTER — Telehealth: Payer: Self-pay | Admitting: *Deleted

## 2018-10-03 LAB — CBC WITH DIFFERENTIAL/PLATELET
ABSOLUTE MONOCYTES: 667 {cells}/uL (ref 200–950)
Basophils Absolute: 58 cells/uL (ref 0–200)
Basophils Relative: 0.5 %
Eosinophils Absolute: 35 cells/uL (ref 15–500)
Eosinophils Relative: 0.3 %
HEMATOCRIT: 36.2 % (ref 35.0–45.0)
Hemoglobin: 12.4 g/dL (ref 11.7–15.5)
Lymphs Abs: 5003 cells/uL — ABNORMAL HIGH (ref 850–3900)
MCH: 32 pg (ref 27.0–33.0)
MCHC: 34.3 g/dL (ref 32.0–36.0)
MCV: 93.3 fL (ref 80.0–100.0)
MPV: 12.1 fL (ref 7.5–12.5)
Monocytes Relative: 5.8 %
Neutro Abs: 5739 cells/uL (ref 1500–7800)
Neutrophils Relative %: 49.9 %
Platelets: 269 10*3/uL (ref 140–400)
RBC: 3.88 10*6/uL (ref 3.80–5.10)
RDW: 12 % (ref 11.0–15.0)
Total Lymphocyte: 43.5 %
WBC: 11.5 10*3/uL — ABNORMAL HIGH (ref 3.8–10.8)

## 2018-10-03 LAB — COMPLETE METABOLIC PANEL WITH GFR
AG Ratio: 1.7 (calc) (ref 1.0–2.5)
ALT: 9 U/L (ref 6–29)
AST: 12 U/L (ref 10–35)
Albumin: 4.2 g/dL (ref 3.6–5.1)
Alkaline phosphatase (APISO): 71 U/L (ref 37–153)
BILIRUBIN TOTAL: 0.2 mg/dL (ref 0.2–1.2)
BUN/Creatinine Ratio: 13 (calc) (ref 6–22)
BUN: 14 mg/dL (ref 7–25)
CO2: 26 mmol/L (ref 20–32)
Calcium: 9.6 mg/dL (ref 8.6–10.4)
Chloride: 106 mmol/L (ref 98–110)
Creat: 1.11 mg/dL — ABNORMAL HIGH (ref 0.50–1.05)
GFR, EST AFRICAN AMERICAN: 66 mL/min/{1.73_m2} (ref >=60)
GFR, Est Non African American: 57 mL/min/{1.73_m2} — ABNORMAL LOW (ref >=60)
Globulin: 2.5 g/dL (calc) (ref 1.9–3.7)
Glucose, Bld: 95 mg/dL (ref 65–99)
Potassium: 4.1 mmol/L (ref 3.5–5.3)
Sodium: 141 mmol/L (ref 135–146)
TOTAL PROTEIN: 6.7 g/dL (ref 6.1–8.1)

## 2018-10-03 NOTE — Telephone Encounter (Addendum)
Patient contacted the office stating she can not hold anything or take a step without pain. Patient states Mobic is the only thing she has found that helps with the pain. Patient would like to be able to make the decision about her kidney funciton and whether or not she would like to take a medication that damages it. Patient states she has been to pain management before and they refuse to do anything for her because she was on Morphine 180 mg daily for 7 years about 15 years ago following 6 back surgeries. Patient states the only thing that pain management wants to do is psychological counseling.

## 2018-10-03 NOTE — Progress Notes (Signed)
WBC count is stable.  Creatinine is elevated 1.11 and GFR is low-57.  She was requesting a prescription for Mobic but Dr. Corliss Skains does not want the patient taking NSAIDs.  Please offer a referral to pain management.

## 2018-10-06 NOTE — Telephone Encounter (Signed)
I spoke with patient.  She states that her creatinine is high because she does not drink a lot of water.  I have advised her to come in in 2 weeks after she drinks enough fluids and we can recheck her creatinine.  If her creatinine is normal we should be able to call in Mobic.

## 2018-10-21 ENCOUNTER — Encounter: Payer: Self-pay | Admitting: Pharmacist

## 2018-10-21 ENCOUNTER — Encounter: Payer: Self-pay | Admitting: *Deleted

## 2018-10-21 ENCOUNTER — Telehealth: Payer: Self-pay | Admitting: Rheumatology

## 2018-10-21 NOTE — Telephone Encounter (Signed)
Patient left a voicemail stating "I appreciate the random generic information about the [place]ona virus sent to my [employer].  I was trying to get a specific case whether I was at high risk or not due to taking Otezla and having a decreased immune system and reduced liver and kidney function.  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

## 2018-10-21 NOTE — Telephone Encounter (Signed)
Information and letter have been sent to patient via my chart.

## 2018-10-21 NOTE — Telephone Encounter (Signed)
Returned patient call.  Regarding your medications we recommend you continue them at this time as prescribed. In the event you do become sick please follow recommended procedure to hold medication until symptoms have resolved.  We understand concerns about increased risk of infection. If you do choose to stop your medications understand you are putting yourself at risk for a flare. Please call the office for any questions or acute issues. We are allowing some flexibly for labs and follow up visit. If you are having current respiratory symptoms do not come to the office, please utilize e-visits and your PCP for appropriate assessment and testing.   Patient asked for letter to provide to work stating her condition and she is  immunosuppressive therapy.  Letter sent via MyChart.

## 2018-10-21 NOTE — Telephone Encounter (Signed)
Patient would like to know if she is a high risk patient due to medication she is on. Patient needs a note stating this for work. Patient wants note emailed if possible. Please call patient to advise.

## 2018-11-03 ENCOUNTER — Telehealth (INDEPENDENT_AMBULATORY_CARE_PROVIDER_SITE_OTHER): Payer: Self-pay | Admitting: Rheumatology

## 2018-11-03 NOTE — Telephone Encounter (Signed)
10/02/2018 ov note faxed to [employer] [phone]

## 2018-11-07 ENCOUNTER — Other Ambulatory Visit: Payer: Self-pay | Admitting: *Deleted

## 2018-11-07 MED ORDER — APREMILAST 30 MG PO TABS: 1.0000 | ORAL_TABLET | Freq: Two times a day (BID) | ORAL | 0 refills | Status: DC

## 2018-11-07 NOTE — Telephone Encounter (Signed)
Refill request received via fax  Last visit: 10/02/18 Next visit: 03/05/19  Okay to refill per Dr. Deveshwar  

## 2018-11-26 ENCOUNTER — Telehealth: Payer: Self-pay | Admitting: Rheumatology

## 2018-11-26 NOTE — Telephone Encounter (Signed)
Patient calling to see if we can reduce her bill due to Covid, and her insurance changing in January. Thru patient's [employer], she is no longer in network. Patient received a $700.+ dollar bill. Please call to discuss.

## 2019-01-26 ENCOUNTER — Other Ambulatory Visit: Payer: Self-pay | Admitting: *Deleted

## 2019-01-26 MED ORDER — OTEZLA 30 MG PO TABS: 1.0000 | ORAL_TABLET | Freq: Two times a day (BID) | ORAL | 0 refills | Status: DC

## 2019-01-26 NOTE — Telephone Encounter (Signed)
Refill request received via fax  Last visit: 10/02/18 Next visit: 03/05/19  Okay to refill per Dr. Estanislado Pandy

## 2019-02-13 ENCOUNTER — Telehealth: Payer: Self-pay | Admitting: Rheumatology

## 2019-02-13 NOTE — Telephone Encounter (Signed)
Left message to advise patient Dr. Estanislado Pandy, and she is ok with the patient continuing on [place]

## 2019-02-13 NOTE — Telephone Encounter (Signed)
Patient calling because she had a tooth extraction, and it got infected. Patient is on [place], and wants to know if she should stop medication. Please call to advise.

## 2019-02-13 NOTE — Telephone Encounter (Signed)
I spoke with Dr. Estanislado Pandy, and she is ok with the patient continuing on [place].

## 2019-02-19 NOTE — Progress Notes (Deleted)
Office Visit Note  Patient: Madeline Ayala             Date of Birth: 1965-09-22           MRN: [id number]             [facility] Ref[facility]: [facility] Visit Date: 03/05/2019 Occupation: @GUAROCC @  Subjective:  No chief complaint on file.   History of Present Illness: Madeline Paresawnya Hadsall is a 53 y.o. female ***   Activities of Daily Living:  Patient reports morning stiffness for *** {minute/hour:19697}.   Patient {ACTIONS;DENIES/REPORTS:21021675::"Denies"} nocturnal pain.  Difficulty dressing/grooming: {ACTIONS;DENIES/REPORTS:21021675::"Denies"} Difficulty climbing stairs: {ACTIONS;DENIES/REPORTS:21021675::"Denies"} Difficulty getting out of chair: {ACTIONS;DENIES/REPORTS:21021675::"Denies"} Difficulty using hands for taps, buttons, cutlery, and/or writing: {ACTIONS;DENIES/REPORTS:21021675::"Denies"}  No Rheumatology ROS completed.   PMFS History:  Patient Active Problem List   Diagnosis Date Noted  . Psoriatic arthritis (HCC) 10/29/2016  . Psoriasis 10/29/2016  . High risk medication use 10/29/2016  . Needle phobia 10/29/2016  . DDD (degenerative disc disease), lumbar 10/29/2016  . DDD (degenerative disc disease), cervical 10/29/2016  . History of scoliosis 10/29/2016  . History of asthma 10/29/2016  . History of duodenal ulcer 10/29/2016    Past Medical History:  Diagnosis Date  . Arthritis   . Asthma     Family History  Problem Relation Age of Onset  . Diabetes Mother   . COPD Mother   . Spina bifida Mother   . Thyroid disease Mother   . Scoliosis Mother   . Heart disease Mother   . Macular degeneration Father   . Skin cancer Father        on ear  . Depression Brother   . Bipolar disorder Brother   . Depression Daughter   . Post-traumatic stress disorder Daughter    Past Surgical History:  Procedure Laterality Date  . BACK SURGERY    . KNEE SURGERY    . TONSILLECTOMY     Social History   Social  History Narrative  . Not on file    There is no immunization history on file for this patient.   Objective: Vital Signs: LMP 04/10/2015    Physical Exam   Musculoskeletal Exam: ***  CDAI Exam: CDAI Score: - Patient Global: -; Provider Global: - Swollen: -; Tender: - Joint Exam   No joint exam has been documented for this visit   There is currently no information documented on the homunculus. Go to the Rheumatology activity and complete the homunculus joint exam.  Investigation: No additional findings.  Imaging: No results found.  Recent Labs: Lab Results  Component Value Date   WBC 11.5 (H) 10/02/2018   HGB 12.4 10/02/2018   PLT 269 10/02/2018   NA 141 10/02/2018   K 4.1 10/02/2018   CL 106 10/02/2018   CO2 26 10/02/2018   GLUCOSE 95 10/02/2018   BUN 14 10/02/2018   CREATININE 1.11 (H) 10/02/2018   BILITOT 0.2 10/02/2018   ALKPHOS 70 10/23/2016   AST 12 10/02/2018   ALT 9 10/02/2018   PROT 6.7 10/02/2018   ALBUMIN 4.4 10/23/2016   CALCIUM 9.6 10/02/2018   GFRAA 66 10/02/2018    Speciality Comments: No specialty comments available.  Procedures:  No procedures performed Allergies: Azithromycin, Erythromycin, Other, Penicillins, Propoxyphene, Sulfa antibiotics, Tetracyclines & related, Tolectin [tolmetin], Vancomycin, Codeine, Hydrocodone, Oxycodone, and Percocet [oxycodone-acetaminophen]   Assessment / Plan:     Visit Diagnoses: No diagnosis found.  Orders: No orders of the defined  types were placed in this encounter.  No orders of the defined types were placed in this encounter.   Face-to-face time spent with patient was *** minutes. Greater than 50% of time was spent in counseling and coordination of care.  Follow-Up Instructions: No follow-ups on file.   Earnestine Mealing, CMA  Note - This record has been created using Editor, commissioning.  Chart creation errors have been sought, but may not always  have been located. Such creation errors do not  reflect on  the standard of medical care.

## 2019-02-23 ENCOUNTER — Telehealth: Payer: Self-pay | Admitting: Rheumatology

## 2019-02-23 NOTE — Telephone Encounter (Signed)
Patient called stating her insurance changed on 08/06/18 and didn't realize that Dr. Estanislado Pandy was not in network with her insurance.  Patient states she is making payments on the $700 bill she received from her last office visit and is currently not working due to [employer].  Patient is requesting a return call to let her know how long she can wait to reschedule before Dr. Estanislado Pandy will not send in a refill for her medication.

## 2019-02-24 NOTE — Telephone Encounter (Signed)
Discussed with Dr. Estanislado Pandy, and she is ok with the patient being seen on a yearly basis.

## 2019-02-24 NOTE — Telephone Encounter (Signed)
Patient advised per Dr. Estanislado Pandy she may be seen on a yearly basis. Patient has been scheduled for 10/02/19 at 10:15 am.

## 2019-03-05 ENCOUNTER — Ambulatory Visit: Payer: Self-pay | Admitting: Rheumatology

## 2019-04-15 ENCOUNTER — Telehealth: Payer: Self-pay | Admitting: Pharmacist

## 2019-04-15 NOTE — Telephone Encounter (Signed)
Received call from [facility] from representative stating that they have been attempting to contact patient to fill Otezla.  Patient informed pharmacy that she is now getting the drug from the manufacturer.  They want to know if they are supposed to be filling her [facility] prescription or if she is getting it from another source.  Called to discuss with patient.  Patient states she lost her job in March and her insurance ran out in June.  She is unable to afford her medication.  She is completely out of medication.  She was on patient assistance in the past but states she is not sure of the process as our office take care of it.  Informed patient that she is not currently enrolled in patient assistance based off of our records.  Patient assistance requires reenrollment every year.  Patient verbalized understanding.  Patient will need to fill out patient assistance application to re-enroll.  She would like to fill out the application online.  Patient given directions to website to download application.  She is concerned she will not be approved based off of income last year and she is now unemployed.  Informed patient that many patient assistant programs have made exceptions for people who have lost their jobs and/or have been affected by COVID-19.  Advised patient to call the one [phone] on the application to discuss with patient assistance and any documents required.  Patient verbalized understanding.  Patient is completely out of medication.  Samples reserved for patient and she will pick up from the office tomorrow.    All questions encouraged and answered.  Instructed patient to call with any questions about application or any other concerns.  Mariella Saa, PharmD, [place], [place] Clinical Specialty Pharmacist [phone]  04/15/2019 5:00 PM

## 2019-04-16 NOTE — Telephone Encounter (Signed)
Patient presented to office today to pick up samples.  Medication Samples have been provided to the patient.  Drug name: Rutherford Nail Strength: 30 mg        Qty: 14   LOT: [id number]  Exp.Date: 08/2019  Dosing instructions: Take 1 tablet twice daily  The patient has been instructed regarding the correct time, dose, and frequency of taking this medication, including desired effects and most common side effects.   She was able to contact [employer]. They were able to locate her in their system.  They will need to verify her changes in insurance and can then have her fill out application online.  Rutherford Nail support stated they would call her in 24 hours with an update.  Instructed patient to let us know if she has issues or there is anything we can do to help.     Mariella Saa, PharmD, [place], [place] Clinical Specialty Pharmacist [phone]  04/16/2019 12:24 PM

## 2019-05-26 ENCOUNTER — Telehealth: Payer: Self-pay | Admitting: Rheumatology

## 2019-05-26 NOTE — Telephone Encounter (Signed)
Patient advised she may come by the office to pick up sample. Patient verbalized understanding.

## 2019-05-26 NOTE — Telephone Encounter (Signed)
Patient left a voicemail stating [place] told her they will be faxing a form to our office to be completed before medication will be sent.  Patient is requesting another month of samples until her medication arrives.

## 2019-06-02 NOTE — Telephone Encounter (Signed)
Medication Samples have been provided to the patient.  Drug name: Rutherford Nail     Strength: 30 mg        Qty: 2 LOT: [id number]  Exp.Date: 11/2019  Dosing instructions: Take 1 tablet twice daily   The patient has been instructed regarding the correct time, dose, and frequency of taking this medication, including desired effects and most common side effects.   Gwenlyn Perking 2:42 PM 06/02/2019

## 2019-06-09 NOTE — Telephone Encounter (Signed)
Patient called and plans to email her Rutherford Nail support application. We will fax application once received along with provider portion and update patient when we receive a response.   Patient states she does not need samples at this time.  Instructed patient to call with any other questions or concerns.   Mariella Saa, PharmD, [place], [place] Clinical Specialty Pharmacist [phone]  06/09/2019 10:47 AM

## 2019-06-15 NOTE — Telephone Encounter (Signed)
Submitted Patient Assistance Application to [employer] for [employer]. Will follow up to confirm receipt.   Will send documents to scan center.  Fax# [phone] Phone# [phone]

## 2019-06-17 ENCOUNTER — Telehealth: Payer: Self-pay | Admitting: Rheumatology

## 2019-06-17 NOTE — Telephone Encounter (Signed)
Madeline Ayala from [place] patient assistance program left a voicemail stating they are missing the patient's portion of the application and requesting a return call. [phone]

## 2019-06-22 NOTE — Telephone Encounter (Signed)
Refaxed patient's PAP application. It was fully completed. Awaiting response.  9:53 AM Madeline Ayala, CPhT

## 2019-06-23 ENCOUNTER — Telehealth: Payer: Self-pay | Admitting: Rheumatology

## 2019-06-23 NOTE — Telephone Encounter (Signed)
Received patient's updated application.  Refaxed to [facility].  We will update patient when we receive a response.  Mariella Saa, PharmD, [place], [facility] Clinical Specialty Pharmacist [phone]  06/23/2019 11:19 AM

## 2019-06-23 NOTE — Telephone Encounter (Signed)
Called [employer] and rep Janett Billow confirmed they have a new PAP application that is not yet online. They will fax a copy to the office and mailed a copy to the patient. Called patient, she has already received new application and will e-mail it to [employer] for submission. Patient still has samples on hand.   11:10 AM Beatriz Chancellor, CPhT

## 2019-06-23 NOTE — Telephone Encounter (Signed)
Patient called stating [employer] is not accepting her application because they received it after 06/10/19.  Patient states she was told they have a new application which she needs to fill out again.  Patient is requesting a return call.

## 2019-07-10 NOTE — Telephone Encounter (Signed)
Called [employer] to follow up on patient's application. Rep requested page 2 of the application to be resubmitted. Faxed and requested it to be expedited.  Will call to follow up.  Phone# [phone]

## 2019-07-13 ENCOUNTER — Telehealth: Payer: Self-pay | Admitting: Rheumatology

## 2019-07-13 NOTE — Telephone Encounter (Signed)
Called [employer] to follow up on patient's application. Rep advised it takes up to 72 hours to process new faxes. Will call back on Wednesday.

## 2019-07-13 NOTE — Telephone Encounter (Signed)
Patient left a message stating Pharmacy still has not processed her application for [facility], and it has been three months now. She would like someone to call pharmacy to see what is going on, and to see if we can help the process. In the meantime, patient is in the need for another sample. Please call to advise.

## 2019-07-13 NOTE — Telephone Encounter (Signed)
Had to refax patient's documents on Friday due to [place] stating they were unclear. Called today and [employer] stated it would take up to 72 hours to process new faxes. Requested patient's documents to be expedited via fax and verbally as patient needs coverage now, not only in 2021. Will follow up with [employer] and Patient on Wednesday.  Please advise patient when she can come pick up a sample of Otezla. She states she will be out on Friday.  4:19 PM Beatriz Chancellor, CPhT

## 2019-07-13 NOTE — Telephone Encounter (Signed)
Spoke with patient and advised her of information below, patient verbalized understanding and states she spoke with [place]. Advised patient she can come pick up a sample of otezla, patient will come tomorrow 07/14/2019.

## 2019-07-14 NOTE — Telephone Encounter (Signed)
Medication Samples have been provided to the patient.  Drug name: otezla  Strength: 30mg    Qty: 1 box LOT: [id number]  Exp.Date: 11/2019  Dosing instructions: Take 1 tablet by mouth twice daily.   The patient has been instructed regarding the correct time, dose, and frequency of taking this medication, including desired effects and most common side effects.   Kylil Swopes C Crisol Muecke 1:55 PM 07/14/2019

## 2019-07-15 ENCOUNTER — Telehealth: Payer: Self-pay | Admitting: Rheumatology

## 2019-07-15 DIAGNOSIS — M19042 Primary osteoarthritis, left hand: Secondary | ICD-10-CM

## 2019-07-15 DIAGNOSIS — M5030 Other cervical disc degeneration, unspecified cervical region: Secondary | ICD-10-CM

## 2019-07-15 DIAGNOSIS — M5136 Other intervertebral disc degeneration, lumbar region: Secondary | ICD-10-CM

## 2019-07-15 NOTE — Telephone Encounter (Signed)
Routing back to myself to follow up

## 2019-07-15 NOTE — Telephone Encounter (Signed)
Advised patient referral has been placed for FCE to be done by physical therapy. Patient verbalized understanding.

## 2019-07-15 NOTE — Telephone Encounter (Signed)
FCE can be done by a physical therapy.  If patient wants we can give her a prescription for FCE.

## 2019-07-15 NOTE — Telephone Encounter (Signed)
Patient calling because her disability company is requiring an FCE to be done. Patient would like to order this so she can get her paperwork filled out. Please call to advise.

## 2019-07-20 ENCOUNTER — Telehealth: Payer: Self-pay | Admitting: Rheumatology

## 2019-07-20 NOTE — Telephone Encounter (Signed)
Patient called requesting a return call with the status of her prescription of Otezla.

## 2019-07-20 NOTE — Telephone Encounter (Signed)
Returned patient's call. Her patient assistance application is in process. Will follow up on Wednesday

## 2019-07-20 NOTE — Telephone Encounter (Signed)
Rep Madeline Ayala states patient's application is still in process. She did see where the case has been expedited. Will follow up on Wednesday if no fax response has been received.  Phone# [phone]

## 2019-07-22 NOTE — Telephone Encounter (Signed)
Received fax from [place], requesting new Otezla rx needed for patient. Old rx form was submitted. Please fax as urgent.

## 2019-07-22 NOTE — Telephone Encounter (Signed)
otezla rx faxed to [employer] as URGENT. Thanks!

## 2019-07-27 NOTE — Telephone Encounter (Signed)
Received fax from [place], application has been APPROVED. Coverage is from 07/24/19 to 07/23/20. Shipment has already been sent to patient.  Will send document to scan Center.  Phone# [phone]

## 2019-09-23 NOTE — Progress Notes (Signed)
Office Visit Note  Patient: Madeline Ayala             Date of Birth: [birth year]           MRN: [id number]             PCP: [facility] Referring: [facility]* Visit Date: 10/02/2019 Occupation: @GUAROCC @  Subjective:  Pain in multiple joints  History of Present Illness: Caylea Foronda is a 54 y.o. female with history of psoriatic arthritis, osteoarthritis, and DDD.  Patient is a taking Otezla 30 mg 1 tablet twice daily.  She has not missed any doses of Otezla recently.  She continues to have chronic pain in multiple joints including both shoulder joints, both hands, both knee joints, and both feet.  She has ongoing plantar fasciitis bilaterally.  She denies any Achilles tendinitis.  She has chronic pain in bilateral SI joints.  She reports that she had to use her walker for the first time in 3 years due to the increased discomfort in her lower back.  She states that she had initial consult pain management but they were unwilling to prescribe any narcotics.   Activities of Daily Living:  Patient reports morning stiffness for 1 hour.   Patient Reports nocturnal pain.  Difficulty dressing/grooming: Reports Difficulty climbing stairs: Reports Difficulty getting out of chair: Reports Difficulty using hands for taps, buttons, cutlery, and/or writing: Reports  Review of Systems  Constitutional: Positive for fatigue.  HENT: Negative for mouth sores, mouth dryness and nose dryness.   Eyes: Positive for dryness. Negative for pain and visual disturbance.  Respiratory: Negative for cough, hemoptysis, shortness of breath and difficulty breathing.   Cardiovascular: Negative for chest pain, palpitations, hypertension and swelling in legs/feet.  Gastrointestinal: Positive for constipation and diarrhea. Negative for blood in stool.  Endocrine: Negative for increased urination.  Genitourinary: Negative for difficulty urinating and painful urination.   Musculoskeletal: Positive for arthralgias, joint pain, joint swelling and morning stiffness. Negative for myalgias, muscle weakness, muscle tenderness and myalgias.  Skin: Positive for rash. Negative for color change, pallor, hair loss, nodules/bumps, skin tightness, ulcers and sensitivity to sunlight.  Allergic/Immunologic: Negative for susceptible to infections.  Neurological: Negative for numbness.  Hematological: Negative for swollen glands.  Psychiatric/Behavioral: Positive for sleep disturbance. Negative for depressed mood and confusion. The patient is not nervous/anxious.     PMFS History:  Patient Active Problem List   Diagnosis Date Noted  . Psoriatic arthritis (HCC) 10/29/2016  . Psoriasis 10/29/2016  . High risk medication use 10/29/2016  . Needle phobia 10/29/2016  . DDD (degenerative disc disease), lumbar 10/29/2016  . DDD (degenerative disc disease), cervical 10/29/2016  . History of scoliosis 10/29/2016  . History of asthma 10/29/2016  . History of duodenal ulcer 10/29/2016    Past Medical History:  Diagnosis Date  . Arthritis   . Asthma     Family History  Problem Relation Age of Onset  . Diabetes Mother   . COPD Mother   . Spina bifida Mother   . Thyroid disease Mother   . Scoliosis Mother   . Heart disease Mother   . Macular degeneration Father   . Skin cancer Father        on ear  . Depression Brother   . Bipolar disorder Brother   . Depression Daughter   . Post-traumatic stress disorder Daughter    Past Surgical History:  Procedure Laterality Date  . BACK SURGERY    .  KNEE SURGERY    . TONSILLECTOMY     Social History   Social History Narrative  . Not on file    There is no immunization history on file for this patient.   Objective: Vital Signs: BP 113/75 (BP Location: Left Arm, Patient Position: Sitting, Cuff Size: Normal)   Pulse 66   Resp 15   Ht 5\' 7"  (1.702 m)   Wt 174 lb 9.6 oz (79.2 kg)   LMP 04/10/2015   BMI 27.35 kg/m     Physical Exam Vitals and nursing note reviewed.  Constitutional:      Appearance: She is well-developed.  HENT:     Head: Normocephalic and atraumatic.  Eyes:     Conjunctiva/sclera: Conjunctivae normal.  Pulmonary:     Effort: Pulmonary effort is normal.  Abdominal:     General: Bowel sounds are normal.     Palpations: Abdomen is soft.  Musculoskeletal:     Cervical back: Normal range of motion.  Lymphadenopathy:     Cervical: No cervical adenopathy.  Skin:    General: Skin is warm and dry.     Capillary Refill: Capillary refill takes less than 2 seconds.  Neurological:     Mental Status: She is alert and oriented to person, place, and time.  Psychiatric:        Behavior: Behavior normal.      Musculoskeletal Exam: Generalized hyperalgesia on exam.  C-spine limited range of motion with lateral rotation.  Limited range of motion lumbar spine with discomfort.  Has tenderness over bilateral SI joints.  Shoulder joints, elbow joints, wrist joints, MCPs, PIPs, DIPs have good range of motion with no synovitis.  She has complete fist formation bilaterally.  She has mild DIP synovial thickening.  Hip joints have slight limited range of motion with discomfort bilaterally.  She has tenderness over the left trochanteric bursa.  Knee joints have good range of motion with no warmth or effusion.  Knee crepitus noted bilaterally.  Ankle joints have good range of motion with no tenderness or inflammation.  Plantar fasciitis bilaterally.  CDAI Exam: CDAI Score: - Patient Global: -; Provider Global: - Swollen: -; Tender: - Joint Exam 10/02/2019   No joint exam has been documented for this visit   There is currently no information documented on the homunculus. Go to the Rheumatology activity and complete the homunculus joint exam.  Investigation: No additional findings.  Imaging: No results found.  Recent Labs: Lab Results  Component Value Date   WBC 11.5 (H) 10/02/2018   HGB 12.4  10/02/2018   PLT 269 10/02/2018   NA 141 10/02/2018   K 4.1 10/02/2018   CL 106 10/02/2018   CO2 26 10/02/2018   GLUCOSE 95 10/02/2018   BUN 14 10/02/2018   CREATININE 1.11 (H) 10/02/2018   BILITOT 0.2 10/02/2018   ALKPHOS 70 10/23/2016   AST 12 10/02/2018   ALT 9 10/02/2018   PROT 6.7 10/02/2018   ALBUMIN 4.4 10/23/2016   CALCIUM 9.6 10/02/2018   GFRAA 66 10/02/2018    Speciality Comments: No specialty comments available.  Procedures:  No procedures performed Allergies: Azithromycin, Erythromycin, Other, Penicillins, Propoxyphene, Sulfa antibiotics, Tetracyclines & related, Tolectin [tolmetin], Vancomycin, Codeine, Hydrocodone, Oxycodone, and Percocet [oxycodone-acetaminophen]   Assessment / Plan:     Visit Diagnoses: Psoriatic arthritis ([place]): She has no synovitis or dactylitis on exam.  She has not had any recent psoriatic arthritis flares.  She is clinically doing well on Otezla 30 mg 1 tablet  twice daily.  She has not missed any doses of Otezla recently.  She continues to have chronic pain in multiple joints including both shoulder joints, both hands, both knee joints, both ankle joints.  She has ongoing plantar fasciitis but no Achilles tendinitis.  She has SI joint tenderness bilaterally.  She will continue taking Otezla as prescribed.  She is due to update lab work but declined at this time due to her insurance not covering lab work.  She will notify us when she can afford updating lab work.  Future orders were placed today.  She is advised to notify us if she develops increased joint swelling.  She will follow-up in the office in 1 year or sooner if necessary.  Psoriasis - She has psoriasis in the left ear canal and scalp.  High risk medication use - Otezla 30 mg po bid.( methotrexate was discontinued in March 2018 by pt-did not feel it was effective). She is due to update CBC and CMP.  Future orders were placed today.  Primary osteoarthritis of both hands: She has mild DIP  synovial thickening.  No tenderness or synovitis was noted on exam.  She has complete fist formation bilaterally.  Joint protection and muscle strengthening were discussed.   DDD (degenerative disc disease), cervical: Chronic pain.  She has limited range of motion with lateral rotation.  DDD (degenerative disc disease), lumbar: Chronic pain.  She has had several back surgeries in the past.  She has midline spinal tenderness and tenderness over bilateral SI joints.   Needle phobia - She does not want to start on injectable medication.  History of scoliosis: Chronic pain   History of duodenal ulcer  History of asthma  Smoker - She vapes.  Orders: No orders of the defined types were placed in this encounter.  No orders of the defined types were placed in this encounter.     Follow-Up Instructions: Return in about 1 year (around 10/01/2020) for Psoriatic arthritis.   Gearldine Bienenstock, PA-C  Note - This record has been created using Dragon software.  Chart creation errors have been sought, but may not always  have been located. Such creation errors do not reflect on  the standard of medical care.

## 2019-10-02 ENCOUNTER — Other Ambulatory Visit: Payer: Self-pay

## 2019-10-02 ENCOUNTER — Encounter: Payer: Self-pay | Admitting: Physician Assistant

## 2019-10-02 ENCOUNTER — Ambulatory Visit: Payer: BLUE CROSS/BLUE SHIELD | Admitting: Physician Assistant

## 2019-10-02 VITALS — BP 113/75 | HR 66 | Resp 15 | Ht 67.0 in | Wt 174.6 lb

## 2019-10-02 DIAGNOSIS — M51369 Other intervertebral disc degeneration, lumbar region without mention of lumbar back pain or lower extremity pain: Secondary | ICD-10-CM

## 2019-10-02 DIAGNOSIS — Z8709 Personal history of other diseases of the respiratory system: Secondary | ICD-10-CM

## 2019-10-02 DIAGNOSIS — Z79899 Other long term (current) drug therapy: Secondary | ICD-10-CM

## 2019-10-02 DIAGNOSIS — M5030 Other cervical disc degeneration, unspecified cervical region: Secondary | ICD-10-CM

## 2019-10-02 DIAGNOSIS — F40298 Other specified phobia: Secondary | ICD-10-CM

## 2019-10-02 DIAGNOSIS — Z8739 Personal history of other diseases of the musculoskeletal system and connective tissue: Secondary | ICD-10-CM

## 2019-10-02 DIAGNOSIS — Z8719 Personal history of other diseases of the digestive system: Secondary | ICD-10-CM

## 2019-10-02 DIAGNOSIS — M5136 Other intervertebral disc degeneration, lumbar region: Secondary | ICD-10-CM

## 2019-10-02 DIAGNOSIS — M19042 Primary osteoarthritis, left hand: Secondary | ICD-10-CM

## 2019-10-02 DIAGNOSIS — L409 Psoriasis, unspecified: Secondary | ICD-10-CM

## 2019-10-02 DIAGNOSIS — M19041 Primary osteoarthritis, right hand: Secondary | ICD-10-CM

## 2019-10-02 DIAGNOSIS — L4050 Arthropathic psoriasis, unspecified: Secondary | ICD-10-CM

## 2019-10-02 DIAGNOSIS — F17200 Nicotine dependence, unspecified, uncomplicated: Secondary | ICD-10-CM

## 2019-10-09 ENCOUNTER — Other Ambulatory Visit: Payer: Self-pay | Admitting: Rheumatology

## 2019-10-09 NOTE — Telephone Encounter (Signed)
Called patient to schedule 1 year follow-up appointment.  Patient states she will call back in a couple of months once she knows her schedule for next year.

## 2019-10-09 NOTE — Telephone Encounter (Signed)
Last Visit: 10/02/19 Next Visit: due Feb. 2022. Message sent to the front to schedule patient   Okay to refill per Dr. Corliss Skains

## 2019-10-09 NOTE — Telephone Encounter (Signed)
Please schedule patient for a follow up visit. Patient due Feb 2022. Thanks!

## 2019-12-24 ENCOUNTER — Other Ambulatory Visit: Payer: Self-pay | Admitting: Rheumatology

## 2019-12-24 NOTE — Telephone Encounter (Signed)
Last Visit: 10/02/2019 Next Visit: due 09/2020, message sent to the front desk to schedule.   Okay to refill per Dr. Corliss Skains.

## 2020-03-09 ENCOUNTER — Other Ambulatory Visit: Payer: Self-pay | Admitting: Rheumatology

## 2020-03-09 NOTE — Telephone Encounter (Signed)
Last Visit: 10/02/2019 Next Visit: 09/26/2020  Current Dose per office note on 10/02/2019: Otezla 30 mg po bid  Okay to refill per Dr. Corliss Skains

## 2020-06-08 ENCOUNTER — Telehealth: Payer: Self-pay

## 2020-06-08 MED ORDER — OTEZLA 30 MG PO TABS: 1.0000 | ORAL_TABLET | Freq: Two times a day (BID) | ORAL | 0 refills | Status: DC

## 2020-06-08 NOTE — Telephone Encounter (Signed)
Last Visit:10/02/2019 Next Visit:09/26/2020  Current Dose per office note on 10/02/2019: Otezla 30 mg po bid  Okay to refill per Dr. Corliss Skains

## 2020-06-08 NOTE — Telephone Encounter (Signed)
Patient called requesting prescription refill of Madeline Ayala to be faxed to [facility] at [phone]

## 2020-06-29 ENCOUNTER — Telehealth: Payer: Self-pay | Admitting: Pharmacist

## 2020-06-29 DIAGNOSIS — L4050 Arthropathic psoriasis, unspecified: Secondary | ICD-10-CM

## 2020-06-29 MED ORDER — OTEZLA 30 MG PO TABS: 1.0000 | ORAL_TABLET | Freq: Two times a day (BID) | ORAL | 0 refills | Status: DC

## 2020-06-29 NOTE — Telephone Encounter (Addendum)
Completed renewal application for [employer] for OTEZLA patient assistance. Completed office portion. Patient spoke with [employer] yesterday and is having patient portion of application mailed. She plans to mail her portion directly back to [employer].  [employer] phone: [phone]  Patient is also requesting a prescription - she has been out of the medication for about 3-4 days. Rx was sent to incorrect pharmacy. Have re-sent to [facility]. They will be unable to fill until Monday at the earliest d/t being closed through the holiday and weekend.  Pharmacy phone: [phone]  Chesley Mires, PharmD, MPH Clinical Pharmacist (Rheumatology and Pulmonology)

## 2020-08-12 ENCOUNTER — Telehealth: Payer: Self-pay | Admitting: Rheumatology

## 2020-08-12 NOTE — Telephone Encounter (Signed)
Adult nurse [facility] needs to confirm insurance is no longer active. Patient marked no insurance on patient assistance form, but then put down insurance information. Please call to advise.

## 2020-08-12 NOTE — Telephone Encounter (Signed)
Called patient, she says [employer] called her this morning and advised them that the insurance listed is no longer active. [employer] is updating their records and will process application. They will fax office a determination once received.  Nothing further needed.

## 2020-09-12 NOTE — Progress Notes (Deleted)
Office Visit Note  Patient: Madeline Ayala             Date of Birth: June 26, 1966           MRN: [id number]             PCP: [facility] Referring: [facility]* Visit Date: 09/26/2020 Occupation: @GUAROCC @  Subjective:  No chief complaint on file.   History of Present Illness: Madeline Ayala is a 54 y.o. female ***   Activities of Daily Living:  Patient reports morning stiffness for *** {minute/hour:19697}.   Patient {ACTIONS;DENIES/REPORTS:21021675::"Denies"} nocturnal pain.  Difficulty dressing/grooming: {ACTIONS;DENIES/REPORTS:21021675::"Denies"} Difficulty climbing stairs: {ACTIONS;DENIES/REPORTS:21021675::"Denies"} Difficulty getting out of chair: {ACTIONS;DENIES/REPORTS:21021675::"Denies"} Difficulty using hands for taps, buttons, cutlery, and/or writing: {ACTIONS;DENIES/REPORTS:21021675::"Denies"}  No Rheumatology ROS completed.   PMFS History:  Patient Active Problem List   Diagnosis Date Noted  . Psoriatic arthritis (HCC) 10/29/2016  . Psoriasis 10/29/2016  . High risk medication use 10/29/2016  . Needle phobia 10/29/2016  . DDD (degenerative disc disease), lumbar 10/29/2016  . DDD (degenerative disc disease), cervical 10/29/2016  . History of scoliosis 10/29/2016  . History of asthma 10/29/2016  . History of duodenal ulcer 10/29/2016    Past Medical History:  Diagnosis Date  . Arthritis   . Asthma     Family History  Problem Relation Age of Onset  . Diabetes Mother   . COPD Mother   . Spina bifida Mother   . Thyroid disease Mother   . Scoliosis Mother   . Heart disease Mother   . Macular degeneration Father   . Skin cancer Father        on ear  . Depression Brother   . Bipolar disorder Brother   . Depression Daughter   . Post-traumatic stress disorder Daughter    Past Surgical History:  Procedure Laterality Date  . BACK SURGERY    . KNEE SURGERY    . TONSILLECTOMY     Social History   Social  History Narrative  . Not on file    There is no immunization history on file for this patient.   Objective: Vital Signs: LMP 04/10/2015    Physical Exam   Musculoskeletal Exam: ***  CDAI Exam: CDAI Score: - Patient Global: -; Provider Global: - Swollen: -; Tender: - Joint Exam 09/26/2020   No joint exam has been documented for this visit   There is currently no information documented on the homunculus. Go to the Rheumatology activity and complete the homunculus joint exam.  Investigation: No additional findings.  Imaging: No results found.  Recent Labs: Lab Results  Component Value Date   WBC 11.5 (H) 10/02/2018   HGB 12.4 10/02/2018   PLT 269 10/02/2018   NA 141 10/02/2018   K 4.1 10/02/2018   CL 106 10/02/2018   CO2 26 10/02/2018   GLUCOSE 95 10/02/2018   BUN 14 10/02/2018   CREATININE 1.11 (H) 10/02/2018   BILITOT 0.2 10/02/2018   ALKPHOS 70 10/23/2016   AST 12 10/02/2018   ALT 9 10/02/2018   PROT 6.7 10/02/2018   ALBUMIN 4.4 10/23/2016   CALCIUM 9.6 10/02/2018   GFRAA 66 10/02/2018    Speciality Comments: No specialty comments available.  Procedures:  No procedures performed Allergies: Azithromycin, Erythromycin, Other, Penicillins, Propoxyphene, Sulfa antibiotics, Tetracyclines & related, Tolectin [tolmetin], Vancomycin, Codeine, Hydrocodone, Oxycodone, and Percocet [oxycodone-acetaminophen]   Assessment / Plan:     Visit Diagnoses: No diagnosis found.  Orders: No orders of the  defined types were placed in this encounter.  No orders of the defined types were placed in this encounter.   Face-to-face time spent with patient was *** minutes. Greater than 50% of time was spent in counseling and coordination of care.  Follow-Up Instructions: No follow-ups on file.   Ellen Henri, CMA  Note - This record has been created using Animal nutritionist.  Chart creation errors have been sought, but may not always  have been located. Such creation  errors do not reflect on  the standard of medical care.

## 2020-09-26 ENCOUNTER — Ambulatory Visit: Payer: Medicaid Other | Admitting: Rheumatology

## 2020-09-26 DIAGNOSIS — M5136 Other intervertebral disc degeneration, lumbar region: Secondary | ICD-10-CM

## 2020-09-26 DIAGNOSIS — Z8709 Personal history of other diseases of the respiratory system: Secondary | ICD-10-CM

## 2020-09-26 DIAGNOSIS — M19041 Primary osteoarthritis, right hand: Secondary | ICD-10-CM

## 2020-09-26 DIAGNOSIS — Z8739 Personal history of other diseases of the musculoskeletal system and connective tissue: Secondary | ICD-10-CM

## 2020-09-26 DIAGNOSIS — L4050 Arthropathic psoriasis, unspecified: Secondary | ICD-10-CM

## 2020-09-26 DIAGNOSIS — L409 Psoriasis, unspecified: Secondary | ICD-10-CM

## 2020-09-26 DIAGNOSIS — F40298 Other specified phobia: Secondary | ICD-10-CM

## 2020-09-26 DIAGNOSIS — F17200 Nicotine dependence, unspecified, uncomplicated: Secondary | ICD-10-CM

## 2020-09-26 DIAGNOSIS — M5030 Other cervical disc degeneration, unspecified cervical region: Secondary | ICD-10-CM

## 2020-09-26 DIAGNOSIS — Z79899 Other long term (current) drug therapy: Secondary | ICD-10-CM

## 2020-09-26 DIAGNOSIS — Z8719 Personal history of other diseases of the digestive system: Secondary | ICD-10-CM

## 2020-09-29 NOTE — Telephone Encounter (Signed)
Called [employer] for [employer] PAP renewal application update.  Application is currently pending due to missing proof of income. MyChart message sent to patient to advise - provided [employer] phone and [employer] fax numbers.  Chesley Mires, PharmD, MPH Clinical Pharmacist (Rheumatology and Pulmonology)

## 2020-10-03 ENCOUNTER — Other Ambulatory Visit: Payer: Self-pay

## 2020-10-03 DIAGNOSIS — L4050 Arthropathic psoriasis, unspecified: Secondary | ICD-10-CM

## 2020-10-03 MED ORDER — OTEZLA 30 MG PO TABS: 1.0000 | ORAL_TABLET | Freq: Two times a day (BID) | ORAL | 0 refills | Status: DC

## 2020-10-03 NOTE — Telephone Encounter (Signed)
Last Visit: 10/02/2019 Next Visit: 12/08/2020  Current Dose per office note on 10/02/2019, Otezla 30 mg po bid Dx: Psoriatic arthritis   Last Fill: 06/29/2020  Okay to refill [place]?

## 2020-10-03 NOTE — Telephone Encounter (Signed)
Madeline Ayala with [facility] left a voicemail stating a prescription is needed for patient's Otezla medication.  Please fax prescription to #[phone]  For verbal prescription, please call #[phone]

## 2020-11-17 NOTE — Telephone Encounter (Signed)
Received a fax from  [employer] regarding an approval for OTEZLA patient assistance from 11/16/20 to 09/29/21.   Phone number: [phone]

## 2020-12-07 NOTE — Progress Notes (Signed)
Office Visit Note  Patient: Madeline Ayala             Date of Birth: 05/06/1966           MRN: [id number]             PCP: [facility] Referring: [facility]* Visit Date: 12/08/2020 Occupation: @GUAROCC @  Subjective:  Pain in multiple joints.   History of Present Illness: Madeline Ayala is a 55 y.o. female with history of psoriatic arthritis, psoriasis and osteoarthritis.  She states she has been experiencing increased pain and discomfort in her shoulders, right wrist, knees and her feet.  She has not seen any visible swelling.  She states her right wrist pain is interfering with her routine activities.  She is having difficulty doing self-care.  She states she mostly remembers her nighttime dose of Otezla but forgets the a.m. 53.  Activities of Daily Living:  Patient reports morning stiffness for 2-4  hours.   Patient Reports nocturnal pain.  Difficulty dressing/grooming: Reports Difficulty climbing stairs: Reports Difficulty getting out of chair: Reports Difficulty using hands for taps, buttons, cutlery, and/or writing: Reports  Review of Systems  Constitutional: Positive for fatigue.  HENT: Negative for mouth sores, mouth dryness and nose dryness.   Eyes: Positive for visual disturbance and dryness. Negative for pain and itching.  Respiratory: Negative for shortness of breath and difficulty breathing.   Cardiovascular: Negative for chest pain and palpitations.  Gastrointestinal: Negative for blood in stool, constipation and diarrhea.  Endocrine: Negative for increased urination.  Genitourinary: Negative for difficulty urinating.  Musculoskeletal: Positive for arthralgias, joint pain, myalgias, morning stiffness, muscle tenderness and myalgias. Negative for joint swelling.  Skin: Positive for rash. Negative for color change and redness.  Allergic/Immunologic: Positive for susceptible to infections.  Neurological: Positive for  headaches. Negative for dizziness, numbness and memory loss.  Hematological: Positive for bruising/bleeding tendency.  Psychiatric/Behavioral: Positive for sleep disturbance. Negative for confusion.    PMFS History:  Patient Active Problem List   Diagnosis Date Noted  . Psoriatic arthritis (HCC) 10/29/2016  . Psoriasis 10/29/2016  . High risk medication use 10/29/2016  . Needle phobia 10/29/2016  . DDD (degenerative disc disease), lumbar 10/29/2016  . DDD (degenerative disc disease), cervical 10/29/2016  . History of scoliosis 10/29/2016  . History of asthma 10/29/2016  . History of duodenal ulcer 10/29/2016    Past Medical History:  Diagnosis Date  . Arthritis   . Asthma     Family History  Problem Relation Age of Onset  . Diabetes Mother   . COPD Mother   . Spina bifida Mother   . Thyroid disease Mother   . Scoliosis Mother   . Heart disease Mother   . Macular degeneration Father   . Skin cancer Father        on ear  . Depression Brother   . Bipolar disorder Brother   . Depression Daughter   . Post-traumatic stress disorder Daughter    Past Surgical History:  Procedure Laterality Date  . BACK SURGERY    . KNEE SURGERY    . TONSILLECTOMY     Social History   Social History Narrative  . Not on file    There is no immunization history on file for this patient.   Objective: Vital Signs: BP 116/76 (BP Location: Left Arm, Patient Position: Sitting, Cuff Size: Normal)   Pulse 64   Resp 15   Ht 5\' 8"  (1.727  m)   Wt 178 lb 12.8 oz (81.1 kg)   LMP 04/10/2015   BMI 27.19 kg/m    Physical Exam Vitals and nursing note reviewed.  Constitutional:      Appearance: She is well-developed.  HENT:     Head: Normocephalic and atraumatic.  Eyes:     Conjunctiva/sclera: Conjunctivae normal.  Cardiovascular:     Rate and Rhythm: Normal rate and regular rhythm.     Heart sounds: Normal heart sounds.  Pulmonary:     Effort: Pulmonary effort is normal.     Breath  sounds: Normal breath sounds.  Abdominal:     General: Bowel sounds are normal.     Palpations: Abdomen is soft.  Musculoskeletal:     Cervical back: Normal range of motion.  Lymphadenopathy:     Cervical: No cervical adenopathy.  Skin:    General: Skin is warm and dry.     Capillary Refill: Capillary refill takes less than 2 seconds.     Comments: Psoriasis patch in her left ear canal  Neurological:     Mental Status: She is alert and oriented to person, place, and time.  Psychiatric:        Behavior: Behavior normal.      Musculoskeletal Exam: C-spine was in good range of motion.  Shoulder joints, and elbow joints.  Good range of motion with no synovitis.  She has some discomfort with range of motion of her right wrist joint.  She had tenderness over right wrist flexor tendons.  There was no tenderness over MCPs.  She has some DIP and PIP thickening but no synovitis.  Hip joints with good range of motion.  She had tenderness in the palpation over SI joints and trochanteric area.  Knee joints with good range of motion with no synovitis.  There was no tenderness over ankles or MTPs.  CDAI Exam: CDAI Score: -- Patient Global: --; Provider Global: -- Swollen: --; Tender: -- Joint Exam 12/08/2020   No joint exam has been documented for this visit   There is currently no information documented on the homunculus. Go to the Rheumatology activity and complete the homunculus joint exam.  Investigation: No additional findings.  Imaging: No results found.  Recent Labs: Lab Results  Component Value Date   WBC 11.5 (H) 10/02/2018   HGB 12.4 10/02/2018   PLT 269 10/02/2018   NA 141 10/02/2018   K 4.1 10/02/2018   CL 106 10/02/2018   CO2 26 10/02/2018   GLUCOSE 95 10/02/2018   BUN 14 10/02/2018   CREATININE 1.11 (H) 10/02/2018   BILITOT 0.2 10/02/2018   ALKPHOS 70 10/23/2016   AST 12 10/02/2018   ALT 9 10/02/2018   PROT 6.7 10/02/2018   ALBUMIN 4.4 10/23/2016   CALCIUM 9.6  10/02/2018   GFRAA 66 10/02/2018    Speciality Comments: No specialty comments available.  Procedures:  No procedures performed Allergies: Azithromycin, Erythromycin, Other, Penicillins, Propoxyphene, Sulfa antibiotics, Tetracyclines & related, Tolectin [tolmetin], Vancomycin, Cephalexin, Ciprofloxacin, Codeine, Hydrocodone, Oxycodone, and Percocet [oxycodone-acetaminophen]   Assessment / Plan:     Visit Diagnoses: Psoriatic arthritis (HCC)-patient states she has been having increased pain and discomfort in her right wrist joint which she describes over the right flexor tendon area.  She had difficulty extending her wrist.  Tenderness was noted over the right flexor tendons.  No synovitis was noted on the examination.  She also complains of discomfort in her knee joints and her feet.  She describes discomfort in  her MTPs but no synovitis was noted.  She has been taking [place] only once a day as she forgets the morning dose.  I made her set up alarm for a.m. dosing in the office today.  We will see response to [facility] twice daily dosing over the next few months.  She has financial difficulties.  Have advised her to use an Ace bandage over her wrist.  Psoriasis-she is with a smaller psoriasis patch in her left ear canal.  High risk medication use - Otezla 30 mg po bid.( methotrexate was discontinued in March 2018 by pt-did not feel it was effective).  She has been taking it only once a day.  She will switch to twice daily dosing.  We do not have any labs in the system since 2020.  She states she had labs with her PCP last month.  Have advised her to forward labs to [place].  Primary osteoarthritis of both hands-she has mild DIP and PIP thickening which causes discomfort.  DDD (degenerative disc disease), cervical-status post C-spine surgery.  She does not have any discomfort.  DDD (degenerative disc disease), lumbar-she continues to have lower back pain.  She states she has been having increased SI  joint discomfort as well.  Needle phobia  History of duodenal ulcer-I gave her samples of Aleve but later I noted that she has a history of duodenal ulcer.  I advised her not to take Aleve because of history of duodenal ulcer.  She has been taking meloxicam 15 mg p.o. daily as well. I advised her not to take meloxicam.  History of scoliosis-she complains of chronic discomfort.  History of asthma  Smoker - She vapes.  Orders: No orders of the defined types were placed in this encounter.  No orders of the defined types were placed in this encounter.    Follow-Up Instructions: Return in about 6 months (around 06/10/2021) for Psoriatic arthritis, Osteoarthritis.   Pollyann Savoy, MD  Note - This record has been created using Animal nutritionist.  Chart creation errors have been sought, but may not always  have been located. Such creation errors do not reflect on  the standard of medical care.

## 2020-12-08 ENCOUNTER — Other Ambulatory Visit: Payer: Self-pay

## 2020-12-08 ENCOUNTER — Ambulatory Visit (INDEPENDENT_AMBULATORY_CARE_PROVIDER_SITE_OTHER): Payer: Self-pay | Admitting: Rheumatology

## 2020-12-08 ENCOUNTER — Encounter: Payer: Self-pay | Admitting: Rheumatology

## 2020-12-08 VITALS — BP 116/76 | HR 64 | Resp 15 | Ht 68.0 in | Wt 178.8 lb

## 2020-12-08 DIAGNOSIS — F40298 Other specified phobia: Secondary | ICD-10-CM

## 2020-12-08 DIAGNOSIS — L4050 Arthropathic psoriasis, unspecified: Secondary | ICD-10-CM

## 2020-12-08 DIAGNOSIS — M19042 Primary osteoarthritis, left hand: Secondary | ICD-10-CM

## 2020-12-08 DIAGNOSIS — F17200 Nicotine dependence, unspecified, uncomplicated: Secondary | ICD-10-CM

## 2020-12-08 DIAGNOSIS — Z8719 Personal history of other diseases of the digestive system: Secondary | ICD-10-CM

## 2020-12-08 DIAGNOSIS — Z8739 Personal history of other diseases of the musculoskeletal system and connective tissue: Secondary | ICD-10-CM

## 2020-12-08 DIAGNOSIS — L409 Psoriasis, unspecified: Secondary | ICD-10-CM

## 2020-12-08 DIAGNOSIS — Z8709 Personal history of other diseases of the respiratory system: Secondary | ICD-10-CM

## 2020-12-08 DIAGNOSIS — M5136 Other intervertebral disc degeneration, lumbar region: Secondary | ICD-10-CM

## 2020-12-08 DIAGNOSIS — M5030 Other cervical disc degeneration, unspecified cervical region: Secondary | ICD-10-CM

## 2020-12-08 DIAGNOSIS — M19041 Primary osteoarthritis, right hand: Secondary | ICD-10-CM

## 2020-12-08 DIAGNOSIS — Z79899 Other long term (current) drug therapy: Secondary | ICD-10-CM

## 2020-12-08 DIAGNOSIS — M51369 Other intervertebral disc degeneration, lumbar region without mention of lumbar back pain or lower extremity pain: Secondary | ICD-10-CM

## 2020-12-08 NOTE — Progress Notes (Deleted)
Medication Samples have been provided to the patient.  Drug name: aleve      Strength: 220mg         Qty: 10 caplets  LOT: [id number]  Exp.Date: 05/05/2021  Dosing instructions: Take 1 caplet by mouth twice daily for 5 days.

## 2021-02-14 ENCOUNTER — Other Ambulatory Visit: Payer: Self-pay

## 2021-02-14 DIAGNOSIS — L4050 Arthropathic psoriasis, unspecified: Secondary | ICD-10-CM

## 2021-02-14 MED ORDER — OTEZLA 30 MG PO TABS: 1.0000 | ORAL_TABLET | Freq: Two times a day (BID) | ORAL | 0 refills | Status: DC

## 2021-02-14 NOTE — Telephone Encounter (Signed)
Next Visit: 06/08/2021  Last Visit: 12/08/2020  Last Fill: 10/03/2020  DX: Psoriatic arthritis  Current Dose per office note 12/08/2020: [place] 30 mg po bid  Okay to refill [place]?

## 2021-02-14 NOTE — Telephone Encounter (Signed)
Patient called requesting prescription refill of Otezla.   

## 2021-05-25 NOTE — Progress Notes (Deleted)
Office Visit Note  Patient: Madeline Ayala             Date of Birth: [birth year]           MRN: [id number]             PCP: [facility] Referring: [facility]* Visit Date: 06/08/2021 Occupation: @GUAROCC @  Subjective:  No chief complaint on file.   History of Present Illness: Larose Batres is a 55 y.o. female ***   Activities of Daily Living:  Patient reports morning stiffness for *** {minute/hour:19697}.   Patient {ACTIONS;DENIES/REPORTS:21021675::"Denies"} nocturnal pain.  Difficulty dressing/grooming: {ACTIONS;DENIES/REPORTS:21021675::"Denies"} Difficulty climbing stairs: {ACTIONS;DENIES/REPORTS:21021675::"Denies"} Difficulty getting out of chair: {ACTIONS;DENIES/REPORTS:21021675::"Denies"} Difficulty using hands for taps, buttons, cutlery, and/or writing: {ACTIONS;DENIES/REPORTS:21021675::"Denies"}  No Rheumatology ROS completed.   PMFS History:  Patient Active Problem List   Diagnosis Date Noted  . Psoriatic arthritis (HCC) 10/29/2016  . Psoriasis 10/29/2016  . High risk medication use 10/29/2016  . Needle phobia 10/29/2016  . DDD (degenerative disc disease), lumbar 10/29/2016  . DDD (degenerative disc disease), cervical 10/29/2016  . History of scoliosis 10/29/2016  . History of asthma 10/29/2016  . History of duodenal ulcer 10/29/2016    Past Medical History:  Diagnosis Date  . Arthritis   . Asthma     Family History  Problem Relation Age of Onset  . Diabetes Mother   . COPD Mother   . Spina bifida Mother   . Thyroid disease Mother   . Scoliosis Mother   . Heart disease Mother   . Macular degeneration Father   . Skin cancer Father        on ear  . Depression Brother   . Bipolar disorder Brother   . Depression Daughter   . Post-traumatic stress disorder Daughter    Past Surgical History:  Procedure Laterality Date  . BACK SURGERY    . KNEE SURGERY    . TONSILLECTOMY     Social History   Social  History Narrative  . Not on file    There is no immunization history on file for this patient.   Objective: Vital Signs: LMP 04/10/2015    Physical Exam   Musculoskeletal Exam: ***  CDAI Exam: CDAI Score: -- Patient Global: --; Provider Global: -- Swollen: --; Tender: -- Joint Exam 06/08/2021   No joint exam has been documented for this visit   There is currently no information documented on the homunculus. Go to the Rheumatology activity and complete the homunculus joint exam.  Investigation: No additional findings.  Imaging: No results found.  Recent Labs: Lab Results  Component Value Date   WBC 11.5 (H) 10/02/2018   HGB 12.4 10/02/2018   PLT 269 10/02/2018   NA 141 10/02/2018   K 4.1 10/02/2018   CL 106 10/02/2018   CO2 26 10/02/2018   GLUCOSE 95 10/02/2018   BUN 14 10/02/2018   CREATININE 1.11 (H) 10/02/2018   BILITOT 0.2 10/02/2018   ALKPHOS 70 10/23/2016   AST 12 10/02/2018   ALT 9 10/02/2018   PROT 6.7 10/02/2018   ALBUMIN 4.4 10/23/2016   CALCIUM 9.6 10/02/2018   GFRAA 66 10/02/2018    Speciality Comments: No specialty comments available.  Procedures:  No procedures performed Allergies: Azithromycin, Erythromycin, Other, Penicillins, Propoxyphene, Sulfa antibiotics, Tetracyclines & related, Tolectin [tolmetin], Vancomycin, Cephalexin, Ciprofloxacin, Codeine, Hydrocodone, Oxycodone, and Percocet [oxycodone-acetaminophen]   Assessment / Plan:     Visit Diagnoses: No diagnosis found.  Orders: No orders  of the defined types were placed in this encounter.  No orders of the defined types were placed in this encounter.   Face-to-face time spent with patient was *** minutes. Greater than 50% of time was spent in counseling and coordination of care.  Follow-Up Instructions: No follow-ups on file.   Ellen Henri, CMA  Note - This record has been created using Animal nutritionist.  Chart creation errors have been sought, but may not always   have been located. Such creation errors do not reflect on  the standard of medical care.

## 2021-06-08 ENCOUNTER — Ambulatory Visit: Payer: Self-pay | Admitting: Physician Assistant

## 2021-06-08 DIAGNOSIS — F17200 Nicotine dependence, unspecified, uncomplicated: Secondary | ICD-10-CM

## 2021-06-08 DIAGNOSIS — Z8719 Personal history of other diseases of the digestive system: Secondary | ICD-10-CM

## 2021-06-08 DIAGNOSIS — L4050 Arthropathic psoriasis, unspecified: Secondary | ICD-10-CM

## 2021-06-08 DIAGNOSIS — L409 Psoriasis, unspecified: Secondary | ICD-10-CM

## 2021-06-08 DIAGNOSIS — Z79899 Other long term (current) drug therapy: Secondary | ICD-10-CM

## 2021-06-08 DIAGNOSIS — F40298 Other specified phobia: Secondary | ICD-10-CM

## 2021-06-08 DIAGNOSIS — M5030 Other cervical disc degeneration, unspecified cervical region: Secondary | ICD-10-CM

## 2021-06-08 DIAGNOSIS — M19041 Primary osteoarthritis, right hand: Secondary | ICD-10-CM

## 2021-06-08 DIAGNOSIS — M5136 Other intervertebral disc degeneration, lumbar region: Secondary | ICD-10-CM

## 2021-06-08 DIAGNOSIS — Z8739 Personal history of other diseases of the musculoskeletal system and connective tissue: Secondary | ICD-10-CM

## 2021-06-08 DIAGNOSIS — Z8709 Personal history of other diseases of the respiratory system: Secondary | ICD-10-CM

## 2021-08-10 NOTE — Progress Notes (Deleted)
Office Visit Note  Patient: Meladee Coppins             Date of Birth: Feb 03, 1966           MRN: [id number]             PCP: [facility] Referring: [facility]* Visit Date: 08/23/2021 Occupation: @GUAROCC @  Subjective:  No chief complaint on file.   History of Present Illness: Stephanee Sclafani is a 56 y.o. female ***   Activities of Daily Living:  Patient reports morning stiffness for *** {minute/hour:19697}.   Patient {ACTIONS;DENIES/REPORTS:21021675::"Denies"} nocturnal pain.  Difficulty dressing/grooming: {ACTIONS;DENIES/REPORTS:21021675::"Denies"} Difficulty climbing stairs: {ACTIONS;DENIES/REPORTS:21021675::"Denies"} Difficulty getting out of chair: {ACTIONS;DENIES/REPORTS:21021675::"Denies"} Difficulty using hands for taps, buttons, cutlery, and/or writing: {ACTIONS;DENIES/REPORTS:21021675::"Denies"}  No Rheumatology ROS completed.   PMFS History:  Patient Active Problem List   Diagnosis Date Noted   Psoriatic arthritis ([place]) 10/29/2016   Psoriasis 10/29/2016   High risk medication use 10/29/2016   Needle phobia 10/29/2016   DDD (degenerative disc disease), lumbar 10/29/2016   DDD (degenerative disc disease), cervical 10/29/2016   History of scoliosis 10/29/2016   History of asthma 10/29/2016   History of duodenal ulcer 10/29/2016    Past Medical History:  Diagnosis Date   Arthritis    Asthma     Family History  Problem Relation Age of Onset   Diabetes Mother    COPD Mother    Spina bifida Mother    Thyroid disease Mother    Scoliosis Mother    Heart disease Mother    Macular degeneration Father    Skin cancer Father        on ear   Depression Brother    Bipolar disorder Brother    Depression Daughter    Post-traumatic stress disorder Daughter    Past Surgical History:  Procedure Laterality Date   BACK SURGERY     KNEE SURGERY     TONSILLECTOMY     Social History   Social History Narrative   Not on  file    There is no immunization history on file for this patient.   Objective: Vital Signs: LMP 04/10/2015    Physical Exam   Musculoskeletal Exam: ***  CDAI Exam: CDAI Score: -- Patient Global: --; Provider Global: -- Swollen: --; Tender: -- Joint Exam 08/23/2021   No joint exam has been documented for this visit   There is currently no information documented on the homunculus. Go to the Rheumatology activity and complete the homunculus joint exam.  Investigation: No additional findings.  Imaging: No results found.  Recent Labs: Lab Results  Component Value Date   WBC 11.5 (H) 10/02/2018   HGB 12.4 10/02/2018   PLT 269 10/02/2018   NA 141 10/02/2018   K 4.1 10/02/2018   CL 106 10/02/2018   CO2 26 10/02/2018   GLUCOSE 95 10/02/2018   BUN 14 10/02/2018   CREATININE 1.11 (H) 10/02/2018   BILITOT 0.2 10/02/2018   ALKPHOS 70 10/23/2016   AST 12 10/02/2018   ALT 9 10/02/2018   PROT 6.7 10/02/2018   ALBUMIN 4.4 10/23/2016   CALCIUM 9.6 10/02/2018   GFRAA 66 10/02/2018    Speciality Comments: No specialty comments available.  Procedures:  No procedures performed Allergies: Azithromycin, Erythromycin, Other, Penicillins, Propoxyphene, Sulfa antibiotics, Tetracyclines & related, Tolectin [tolmetin], Vancomycin, Cephalexin, Ciprofloxacin, Codeine, Hydrocodone, Oxycodone, and Percocet [oxycodone-acetaminophen]   Assessment / Plan:     Visit Diagnoses: No diagnosis found.  Orders: No orders  of the defined types were placed in this encounter.  No orders of the defined types were placed in this encounter.   Face-to-face time spent with patient was *** minutes. Greater than 50% of time was spent in counseling and coordination of care.  Follow-Up Instructions: No follow-ups on file.   Earnestine Mealing, CMA  Note - This record has been created using Editor, commissioning.  Chart creation errors have been sought, but may not always  have been located. Such creation  errors do not reflect on  the standard of medical care.

## 2021-08-23 ENCOUNTER — Ambulatory Visit: Payer: Self-pay | Admitting: Physician Assistant

## 2021-08-23 DIAGNOSIS — F40298 Other specified phobia: Secondary | ICD-10-CM

## 2021-08-23 DIAGNOSIS — L409 Psoriasis, unspecified: Secondary | ICD-10-CM

## 2021-08-23 DIAGNOSIS — M5136 Other intervertebral disc degeneration, lumbar region: Secondary | ICD-10-CM

## 2021-08-23 DIAGNOSIS — Z8709 Personal history of other diseases of the respiratory system: Secondary | ICD-10-CM

## 2021-08-23 DIAGNOSIS — L4050 Arthropathic psoriasis, unspecified: Secondary | ICD-10-CM

## 2021-08-23 DIAGNOSIS — M19041 Primary osteoarthritis, right hand: Secondary | ICD-10-CM

## 2021-08-23 DIAGNOSIS — M5030 Other cervical disc degeneration, unspecified cervical region: Secondary | ICD-10-CM

## 2021-08-23 DIAGNOSIS — Z8739 Personal history of other diseases of the musculoskeletal system and connective tissue: Secondary | ICD-10-CM

## 2021-08-23 DIAGNOSIS — Z79899 Other long term (current) drug therapy: Secondary | ICD-10-CM

## 2021-08-23 DIAGNOSIS — Z8719 Personal history of other diseases of the digestive system: Secondary | ICD-10-CM

## 2021-08-23 DIAGNOSIS — F17200 Nicotine dependence, unspecified, uncomplicated: Secondary | ICD-10-CM

## 2021-09-27 ENCOUNTER — Telehealth: Payer: Self-pay | Admitting: Rheumatology

## 2021-09-27 DIAGNOSIS — L4050 Arthropathic psoriasis, unspecified: Secondary | ICD-10-CM

## 2021-09-27 MED ORDER — OTEZLA 30 MG PO TABS: 1.0000 | ORAL_TABLET | Freq: Two times a day (BID) | ORAL | 0 refills | Status: DC

## 2021-09-27 NOTE — Telephone Encounter (Signed)
Okay to refill Otezla for 3 months.  She should schedule a follow-up visit sometimes in the future.

## 2021-09-27 NOTE — Telephone Encounter (Signed)
Patient advised per Dr. Corliss Skains okay to refill Henderson [place] for 3 months.  Patient advised she should schedule a follow-up visit sometimes in the future. Patient expressed understanding. Prescription called to the pharmacy.

## 2021-09-27 NOTE — Telephone Encounter (Signed)
Patient called the office requesting a refill of Otzela 30mg  to be sent to [employer]. Patient states she is out of medication and would like a verbal to be called in.

## 2021-09-27 NOTE — Telephone Encounter (Signed)
Patient states she is awaiting a response from Disability. Patient states she was advised her visit was estimated to be $111. Patient states she does not have the extra money to come into the office. Patient states coming to the doctor would mean she would have to go with out food or not pay her electricity.   Next Visit: Due November 2022.   Last Visit: 12/08/2020   Last Fill: 10/03/2020   DX: Psoriatic arthritis   Current Dose per office note 12/08/2020: [place] 30 mg po bid   Okay to call refill in for [address]?

## 2021-09-29 ENCOUNTER — Telehealth: Payer: Self-pay | Admitting: Rheumatology

## 2021-09-29 NOTE — Telephone Encounter (Signed)
Patient will have to complete patient assistnace application when she picks up [place] sample. She states she completed her portion application and dropped it off at the clinic  Knox Saliva, PharmD, MPH, BCPS Clinical Pharmacist (Rheumatology and Pulmonology)

## 2021-09-29 NOTE — Telephone Encounter (Signed)
Medication Samples have been provided to the patient.  Drug name: Otezla       Strength: 30 mg        Qty: 2  LOT: [id number]  Exp.Date: 01/03/2023  Dosing instructions: Take one tablet by mouth twice daily.   

## 2021-09-29 NOTE — Telephone Encounter (Signed)
Patient called the office stating she tried to refill her [place] through [employer] and it was denied because her PAP expired. Patient states she would like a call back from [facility] to get that started. Patient states she is also out of medication and would like to pick up a sample if possible.

## 2021-09-29 NOTE — Telephone Encounter (Signed)
Patient advised she may come by the office to pick up samples. Samples reserved in cabinet for patient.

## 2021-10-02 NOTE — Telephone Encounter (Signed)
Provider portion of [employer] PAP renewal application for Otezla placed in Dr. Fatima Sanger folder to be signed  Chesley Mires, PharmD, MPH, BCPS Clinical Pharmacist (Rheumatology and Pulmonology)

## 2021-10-04 NOTE — Telephone Encounter (Signed)
Submitted Patient Assistance RENEWAL Application to [employer] for OTEZLA along with provider portion, patient portion, med list, and income documents. Patient is uninsured and does not have prior [place]. Will update patient when we receive a response. ? ?Fax# [phone] ?Phone# [phone] ? ?Chesley Mires, PharmD, MPH, BCPS ?Clinical Pharmacist (Rheumatology and Pulmonology) ?

## 2021-10-09 NOTE — Telephone Encounter (Signed)
Received a fax from  [employer] regarding an approval for OTEZLA patient assistance from 10/06/21 to 10/07/22.  ? ?Phone number: [phone] ? ?Chesley Mires, PharmD, MPH, BCPS ?Clinical Pharmacist (Rheumatology and Pulmonology) ? ?

## 2022-02-28 ENCOUNTER — Telehealth: Payer: Self-pay | Admitting: Rheumatology

## 2022-02-28 NOTE — Telephone Encounter (Signed)
Patient called the office demanding to schedule an appointment that she "cannot afford" because we were "refusing to refill her medication". Patient states she is having to skip doses because we are "cruel and only want her money".   Patient states its only been 8 months since she was seen so she doesn't know why she cant refill her medication. Informed patient that it has been 14 months since she was seen in office and that we cannot fill until she is seen.   Patient then argued that she cant pay her bills  because we always charge her the day of service. Informed patient that since she will have Medicare starting August 1st she will not have to pay anything the day of service unless she has a co-pay. Patient got upset and yelled that we aren't charging her a co-pay because she has disability.   Patient states she wont be paying anything when she comes in because she wont have her disability check until then. Advised patient again that she will not owe anything that day unless she has labs. Patient states her medication needs to be sent in the day she is seen.

## 2022-03-02 NOTE — Telephone Encounter (Signed)
Sent message to Minerva Areola for input on how to proceed.

## 2022-03-07 ENCOUNTER — Ambulatory Visit: Payer: Medicare Other | Attending: Physician Assistant | Admitting: Physician Assistant

## 2022-03-07 ENCOUNTER — Encounter: Payer: Self-pay | Admitting: Physician Assistant

## 2022-03-07 VITALS — BP 113/75 | HR 69 | Resp 16 | Ht 68.0 in | Wt 161.0 lb

## 2022-03-07 DIAGNOSIS — F17200 Nicotine dependence, unspecified, uncomplicated: Secondary | ICD-10-CM | POA: Insufficient documentation

## 2022-03-07 DIAGNOSIS — M19042 Primary osteoarthritis, left hand: Secondary | ICD-10-CM | POA: Diagnosis present

## 2022-03-07 DIAGNOSIS — M5136 Other intervertebral disc degeneration, lumbar region: Secondary | ICD-10-CM | POA: Insufficient documentation

## 2022-03-07 DIAGNOSIS — Z79899 Other long term (current) drug therapy: Secondary | ICD-10-CM | POA: Insufficient documentation

## 2022-03-07 DIAGNOSIS — L409 Psoriasis, unspecified: Secondary | ICD-10-CM | POA: Diagnosis not present

## 2022-03-07 DIAGNOSIS — F40298 Other specified phobia: Secondary | ICD-10-CM | POA: Diagnosis present

## 2022-03-07 DIAGNOSIS — Z8739 Personal history of other diseases of the musculoskeletal system and connective tissue: Secondary | ICD-10-CM | POA: Diagnosis present

## 2022-03-07 DIAGNOSIS — L4050 Arthropathic psoriasis, unspecified: Secondary | ICD-10-CM | POA: Insufficient documentation

## 2022-03-07 DIAGNOSIS — M19041 Primary osteoarthritis, right hand: Secondary | ICD-10-CM | POA: Insufficient documentation

## 2022-03-07 DIAGNOSIS — M5030 Other cervical disc degeneration, unspecified cervical region: Secondary | ICD-10-CM | POA: Insufficient documentation

## 2022-03-07 DIAGNOSIS — Z8709 Personal history of other diseases of the respiratory system: Secondary | ICD-10-CM | POA: Insufficient documentation

## 2022-03-07 DIAGNOSIS — Z8719 Personal history of other diseases of the digestive system: Secondary | ICD-10-CM | POA: Insufficient documentation

## 2022-03-07 MED ORDER — OTEZLA 30 MG PO TABS: 1.0000 | ORAL_TABLET | Freq: Two times a day (BID) | ORAL | 0 refills | Status: AC

## 2022-03-07 NOTE — Progress Notes (Signed)
Office Visit Note  Patient: Madeline Ayala             Date of Birth: 1965/09/18           MRN: [id number]             PCP: [facility] Referring: [facility]* Visit Date: 03/07/2022 Occupation: @GUAROCC @  Subjective:  Medication monitoring   History of Present Illness: Madeline Ayala is a 56 y.o. female with history of psoriatic arthritis and osteoarthritis.  Patient is prescribed Otezla 30 mg 1 tablet twice daily.  She has been tolerating Otezla without any side effects.  She states that for the past 3 weeks she has been having to space the dose of Otezla to once every other day to avoid running out of her prescription.  She has been experiencing increased pain and stiffness involving multiple joints while having to space the dose of Otezla.  She states that her right knee joint has given out at times and she has had to use a cane or walker to assist with ambulation.  She has also had increased difficulty using her hands due to the severity of pain and stiffness.  She has intermittent discomfort due to planter fasciitis of both feet.  She states that she has not qualified for Medicare which started this month.  Patient requested to have a referral placed to Dr. [facility] at [facility] in [place] to take over her care since it is closer to home.  It is difficult for her to drive to [facility] for her appointments especially in the wintertime.  Activities of Daily Living:  Patient reports morning stiffness for all day. Patient Reports nocturnal pain.  Difficulty dressing/grooming: Reports Difficulty climbing stairs: Reports Difficulty getting out of chair: Reports Difficulty using hands for taps, buttons, cutlery, and/or writing: Reports  Review of Systems  Constitutional:  Positive for fatigue.  HENT:  Negative for mouth sores, mouth dryness and nose dryness.   Eyes:  Positive for dryness. Negative for pain and visual disturbance.  Respiratory:   Negative for cough, hemoptysis, shortness of breath and difficulty breathing.   Cardiovascular:  Negative for chest pain, palpitations, hypertension and swelling in legs/feet.  Gastrointestinal:  Negative for blood in stool, constipation and diarrhea.  Endocrine: Negative for increased urination.  Genitourinary:  Negative for painful urination and involuntary urination.  Musculoskeletal:  Positive for joint pain, gait problem, joint pain, joint swelling, myalgias, muscle weakness, morning stiffness, muscle tenderness and myalgias.  Skin:  Positive for rash. Negative for color change, pallor, hair loss, nodules/bumps, skin tightness, ulcers and sensitivity to sunlight.  Allergic/Immunologic: Positive for susceptible to infections.  Neurological:  Positive for headaches. Negative for numbness and weakness.  Psychiatric/Behavioral:  Positive for sleep disturbance. Negative for depressed mood. The patient is nervous/anxious.     PMFS History:  Patient Active Problem List   Diagnosis Date Noted   Psoriatic arthritis (HCC) 10/29/2016   Psoriasis 10/29/2016   High risk medication use 10/29/2016   Needle phobia 10/29/2016   DDD (degenerative disc disease), lumbar 10/29/2016   DDD (degenerative disc disease), cervical 10/29/2016   History of scoliosis 10/29/2016   History of asthma 10/29/2016   History of duodenal ulcer 10/29/2016    Past Medical History:  Diagnosis Date   Arthritis    Asthma     Family History  Problem Relation Age of Onset   Diabetes Mother    COPD Mother    Spina bifida Mother  Thyroid disease Mother    Scoliosis Mother    Heart disease Mother    Macular degeneration Father    Skin cancer Father        on ear   Depression Brother    Bipolar disorder Brother    Depression Daughter    Post-traumatic stress disorder Daughter    Past Surgical History:  Procedure Laterality Date   BACK SURGERY     KNEE SURGERY     TONSILLECTOMY     Social History   Social  History Narrative   Not on file    There is no immunization history on file for this patient.   Objective: Vital Signs: BP 113/75 (BP Location: Left Arm, Patient Position: Sitting, Cuff Size: Normal)   Pulse 69   Resp 16   Ht 5\' 8"  (1.727 m)   Wt 161 lb (73 kg)   LMP 04/10/2015   BMI 24.48 kg/m    Physical Exam Vitals and nursing note reviewed.  Constitutional:      Appearance: She is well-developed.  HENT:     Head: Normocephalic and atraumatic.  Eyes:     Conjunctiva/sclera: Conjunctivae normal.  Cardiovascular:     Rate and Rhythm: Normal rate and regular rhythm.     Heart sounds: Normal heart sounds.  Pulmonary:     Effort: Pulmonary effort is normal.     Breath sounds: Normal breath sounds.  Abdominal:     General: Bowel sounds are normal.     Palpations: Abdomen is soft.  Musculoskeletal:     Cervical back: Normal range of motion.  Skin:    General: Skin is warm and dry.     Capillary Refill: Capillary refill takes less than 2 seconds.  Neurological:     Mental Status: She is alert and oriented to person, place, and time.  Psychiatric:        Behavior: Behavior normal.      Musculoskeletal Exam: C-spine has slightly limited range of motion without rotation.  Tenderness over both SI joints.  Left shoulder abduction to about 90 degrees.  Right shoulder has full range of motion with no tenderness.  Elbow joints have good range of motion with no tenderness or synovitis.  Wrist joints have good range of motion with no tenderness or synovitis.  Tenderness over all PIP and DIP joints.  Complete fist formation bilaterally.  Hip joints have very limited range of motion.  Painful range of motion of the right knee joint.  Ankle joints have good range of motion with no tenderness or synovitis.  CDAI Exam: CDAI Score: -- Patient Global: --; Provider Global: -- Swollen: --; Tender: -- Joint Exam 03/07/2022   No joint exam has been documented for this visit   There is  currently no information documented on the homunculus. Go to the Rheumatology activity and complete the homunculus joint exam.  Investigation: No additional findings.  Imaging: No results found.  Recent Labs: Lab Results  Component Value Date   WBC 11.5 (H) 10/02/2018   HGB 12.4 10/02/2018   PLT 269 10/02/2018   NA 141 10/02/2018   K 4.1 10/02/2018   CL 106 10/02/2018   CO2 26 10/02/2018   GLUCOSE 95 10/02/2018   BUN 14 10/02/2018   CREATININE 1.11 (H) 10/02/2018   BILITOT 0.2 10/02/2018   ALKPHOS 70 10/23/2016   AST 12 10/02/2018   ALT 9 10/02/2018   PROT 6.7 10/02/2018   ALBUMIN 4.4 10/23/2016   CALCIUM 9.6 10/02/2018  GFRAA 66 10/02/2018    Speciality Comments: No specialty comments available.  Procedures:  No procedures performed Allergies: Azithromycin, Erythromycin, Other, Penicillins, Propoxyphene, Sulfa antibiotics, Tetracyclines & related, Tolectin [tolmetin], Vancomycin, Cephalexin, Ciprofloxacin, Codeine, Hydrocodone, Oxycodone, and Percocet [oxycodone-acetaminophen]   Assessment / Plan:     Visit Diagnoses: Psoriatic arthropathy (HCC) - She presents today with increased joint pain and joint stiffness involving multiple joints.  No synovitis or dactylitis noted.  She has had difficulty performing ADLs due to the severity of pain in her hands.  Overall her psoriatic arthritis has been well controlled taking Otezla 30 mg 1 tablet twice daily.  She has been having to space the dosing of Otezla to 30 mg 1 tablet every other day for the past 3 weeks due to running low on her prescription.  She has been tolerating [place] without any side effects and does not want to make any medication changes at this time.  Refill of [place] was sent to the pharmacy today.  CBC and CMP were also updated today to monitor for drug toxicity.  She requested a referral to [facility] rheumatology in [place] to transfer care since it is closer to home.  A referral was placed today.  She will  follow-up in our office as needed.  Plan: Apremilast (OTEZLA) 30 MG TABS  Psoriasis: Ear canals-She will remain on otezla as prescribed.   High risk medication use -Otezla 30 mg 1 tablet by mouth twice daily.  CBC and CMP were drawn on 10/02/2018.  CBC and CMP will be drawn today to monitor for drug toxicity.  Plan: COMPLETE METABOLIC PANEL WITH GFR, CBC with Differential/Platelet  Primary osteoarthritis of both hands: She has PIP and DIP thickening consistent with osteoarthritis of both hands.  She has been experiencing increased pain and stiffness in both hands since having to space the dose of Otezla.  She has had difficulty performing ADLs as well as using a cane or walker to assist with ambulation due to difficulty with grip. Discussed the importance of regular exercise and good sleep hygiene.   DDD (degenerative disc disease), cervical: C-spine has limited ROM with lateral rotation.  No symptoms of radiculopathy.   DDD (degenerative disc disease), lumbar: Chronic pain.  She has painful ROM.  No symptoms of radiculopathy.   Other medical conditions are listed as follows:   Needle phobia  History of duodenal ulcer  History of scoliosis  History of asthma  Smoker  Orders: Orders Placed This Encounter  Procedures   COMPLETE METABOLIC PANEL WITH GFR   CBC with Differential/Platelet   Ambulatory referral to Rheumatology   Meds ordered this encounter  Medications   Apremilast (OTEZLA) 30 MG TABS    Sig: Take 1 tablet (30 mg total) by mouth 2 (two) times daily.    Dispense:  180 tablet    Refill:  0    Follow-Up Instructions: Return if symptoms worsen or fail to improve, for Psoriatic arthritis.   Gearldine Bienenstock, PA-C  Note - This record has been created using Dragon software.  Chart creation errors have been sought, but may not always  have been located. Such creation errors do not reflect on  the standard of medical care.

## 2022-03-08 ENCOUNTER — Other Ambulatory Visit: Payer: Self-pay | Admitting: *Deleted

## 2022-03-08 DIAGNOSIS — Z79899 Other long term (current) drug therapy: Secondary | ICD-10-CM

## 2022-03-08 DIAGNOSIS — L4050 Arthropathic psoriasis, unspecified: Secondary | ICD-10-CM

## 2022-03-08 DIAGNOSIS — L409 Psoriasis, unspecified: Secondary | ICD-10-CM

## 2022-03-08 LAB — CBC WITH DIFFERENTIAL/PLATELET
Absolute Monocytes: 658 cells/uL (ref 200–950)
Basophils Absolute: 57 cells/uL (ref 0–200)
Basophils Relative: 0.4 %
Eosinophils Absolute: 114 cells/uL (ref 15–500)
Eosinophils Relative: 0.8 %
HCT: 38.9 % (ref 35.0–45.0)
Hemoglobin: 13.4 g/dL (ref 11.7–15.5)
Lymphs Abs: 5205 cells/uL — ABNORMAL HIGH (ref 850–3900)
MCH: 32.2 pg (ref 27.0–33.0)
MCHC: 34.4 g/dL (ref 32.0–36.0)
MCV: 93.5 fL (ref 80.0–100.0)
MPV: 11.5 fL (ref 7.5–12.5)
Monocytes Relative: 4.6 %
Neutro Abs: 8265 cells/uL — ABNORMAL HIGH (ref 1500–7800)
Neutrophils Relative %: 57.8 %
Platelets: 315 10*3/uL (ref 140–400)
RBC: 4.16 10*6/uL (ref 3.80–5.10)
RDW: 12.5 % (ref 11.0–15.0)
Total Lymphocyte: 36.4 %
WBC: 14.3 10*3/uL — ABNORMAL HIGH (ref 3.8–10.8)

## 2022-03-08 LAB — COMPLETE METABOLIC PANEL WITH GFR
AG Ratio: 1.7 (calc) (ref 1.0–2.5)
ALT: 10 U/L (ref 6–29)
AST: 12 U/L (ref 10–35)
Albumin: 4.3 g/dL (ref 3.6–5.1)
Alkaline phosphatase (APISO): 72 U/L (ref 37–153)
BUN: 17 mg/dL (ref 7–25)
CO2: 24 mmol/L (ref 20–32)
Calcium: 9.5 mg/dL (ref 8.6–10.4)
Chloride: 105 mmol/L (ref 98–110)
Creat: 1 mg/dL (ref 0.50–1.03)
Globulin: 2.6 g/dL (calc) (ref 1.9–3.7)
Glucose, Bld: 131 mg/dL — ABNORMAL HIGH (ref 65–99)
Potassium: 4 mmol/L (ref 3.5–5.3)
Sodium: 138 mmol/L (ref 135–146)
Total Bilirubin: 0.3 mg/dL (ref 0.2–1.2)
Total Protein: 6.9 g/dL (ref 6.1–8.1)
eGFR: 66 mL/min/{1.73_m2} (ref >=60)

## 2022-03-08 NOTE — Progress Notes (Signed)
Glucose is elevated, probably not a fasting sample.  White cell count is elevated at 14.3.  Please ask patient if she had recent use of prednisone or cortisone injection.  If she has any symptoms of infection then she should see her PCP.  If none of the above I would suggest repeating CBC with differential in 1 month.

## 2022-03-13 ENCOUNTER — Telehealth: Payer: Self-pay | Admitting: Rheumatology

## 2022-03-13 NOTE — Telephone Encounter (Signed)
Contacted [employer] and verified they did receive the prescription sent on 03/07/2022. It was verified they did receive the prescription and patient placed order. It is due to be shipped on 03/14/2022 and patient should receive it by 03/16/2022.   Patient advised and states she will not need samples since she will be getting her medication this week.

## 2022-03-13 NOTE — Telephone Encounter (Signed)
Patient called the office stating the pharmacy that delivers her Henderson [place] messed up filling the prescription. Patient states she will not have her medication for a while and is completely out. Patient requests to stop by the office to pick up a sample.
